# Patient Record
Sex: Male | Born: 1960 | Race: White | Hispanic: No | Marital: Married | Smoking: Never smoker
Health system: Southern US, Community
[De-identification: ages and names within clinical notes are randomized; demographics above are authoritative.]

## PROBLEM LIST (undated history)

## (undated) DIAGNOSIS — I10 Essential (primary) hypertension: Secondary | ICD-10-CM

## (undated) DIAGNOSIS — R972 Elevated prostate specific antigen [PSA]: Secondary | ICD-10-CM

## (undated) DIAGNOSIS — Z87442 Personal history of urinary calculi: Secondary | ICD-10-CM

## (undated) DIAGNOSIS — Z973 Presence of spectacles and contact lenses: Secondary | ICD-10-CM

## (undated) DIAGNOSIS — L309 Dermatitis, unspecified: Secondary | ICD-10-CM

## (undated) DIAGNOSIS — K219 Gastro-esophageal reflux disease without esophagitis: Secondary | ICD-10-CM

## (undated) DIAGNOSIS — G4730 Sleep apnea, unspecified: Secondary | ICD-10-CM

## (undated) DIAGNOSIS — C801 Malignant (primary) neoplasm, unspecified: Secondary | ICD-10-CM

## (undated) DIAGNOSIS — F419 Anxiety disorder, unspecified: Secondary | ICD-10-CM

## (undated) DIAGNOSIS — F32A Depression, unspecified: Secondary | ICD-10-CM

## (undated) DIAGNOSIS — J45909 Unspecified asthma, uncomplicated: Secondary | ICD-10-CM

## (undated) HISTORY — PX: CHOLECYSTECTOMY: SHX55

## (undated) HISTORY — PX: WISDOM TOOTH EXTRACTION: SHX21

---

## 2003-04-01 HISTORY — PX: CHOLECYSTECTOMY: SHX55

## 2004-02-12 ENCOUNTER — Encounter: Admission: RE | Admit: 2004-02-12 | Discharge: 2004-02-12 | Payer: Self-pay | Admitting: Emergency Medicine

## 2004-06-20 ENCOUNTER — Encounter: Admission: RE | Admit: 2004-06-20 | Discharge: 2004-06-20 | Payer: Self-pay | Admitting: Surgery

## 2006-07-17 IMAGING — CR DG CHEST 2V
2 series · 2 of 2 positions shown · non-contrast
Comparison: none

CLINICAL DATA: Pre-op cholecystectomy. 
 CHEST, TWO VIEWS: 
 Submaximal inspiration is noted.   The heart size and mediastinal contours are unremarkable.  The lungs are clear.  The visualized skeleton is unremarkable.

[view not recorded (1 of 2)]
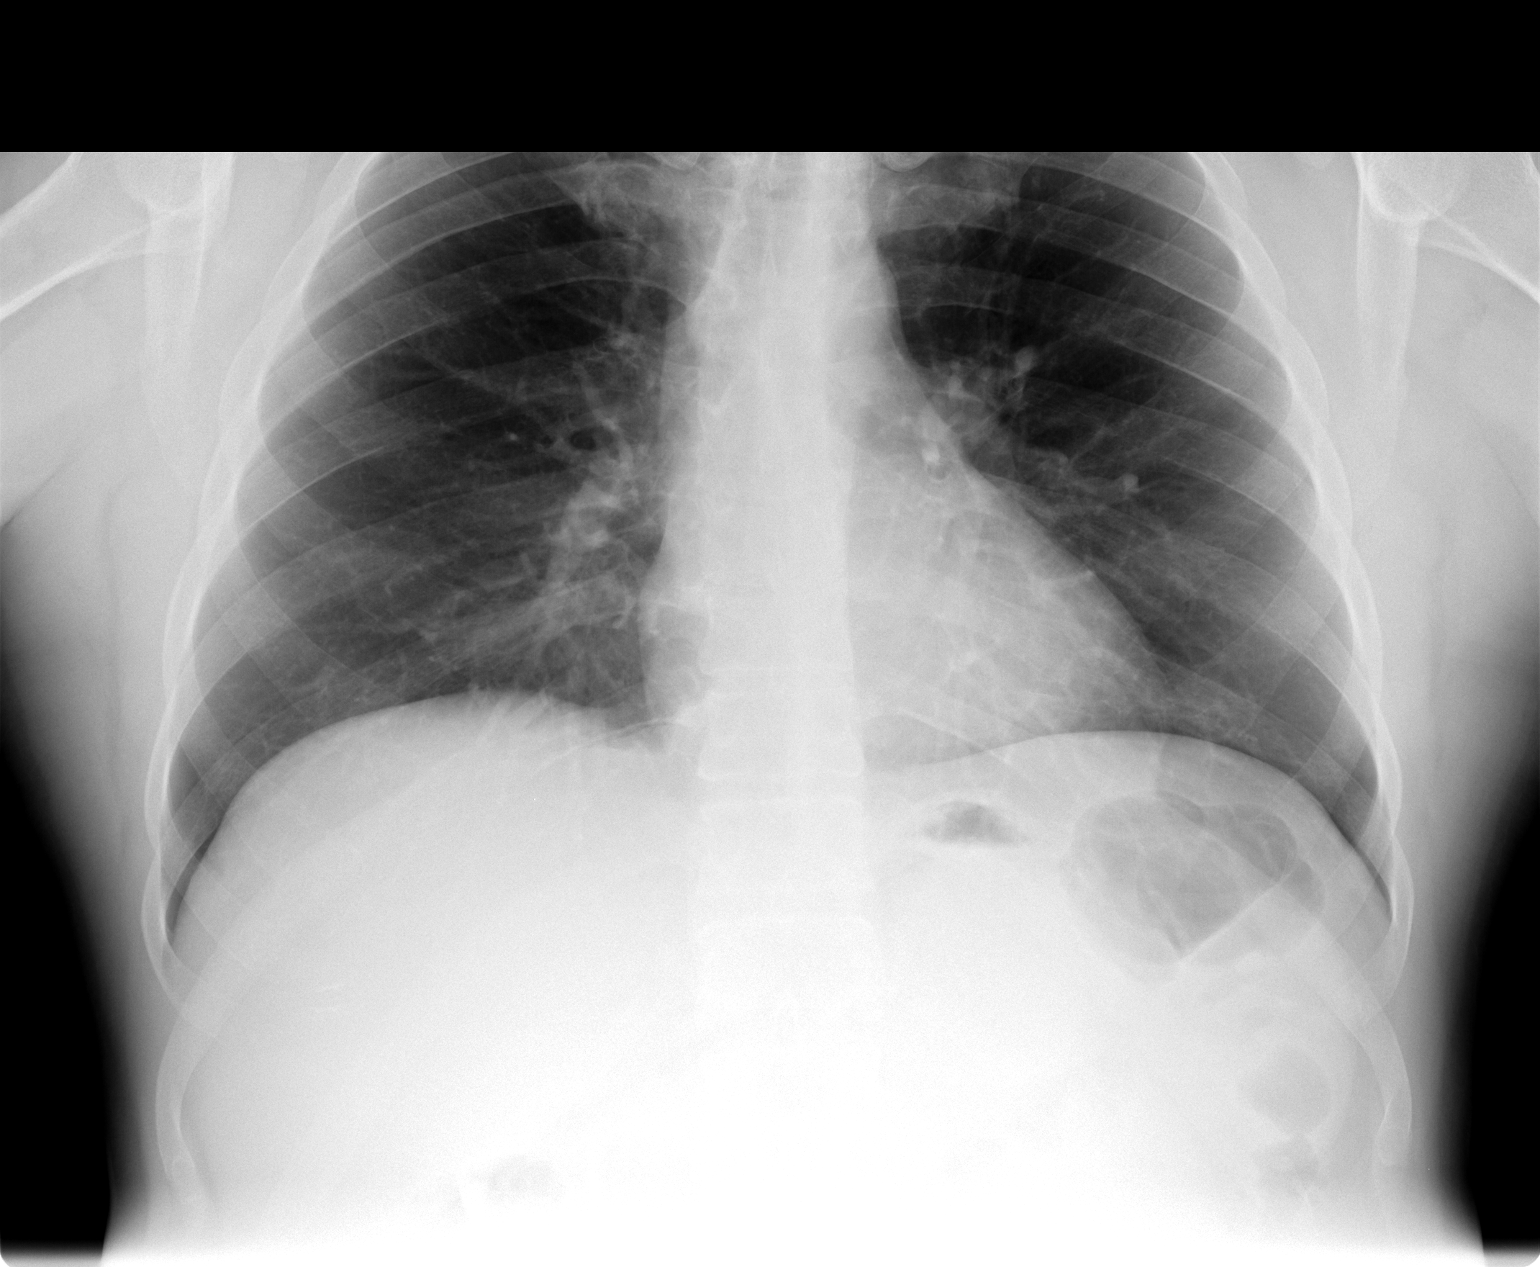

[view not recorded (2 of 2)]
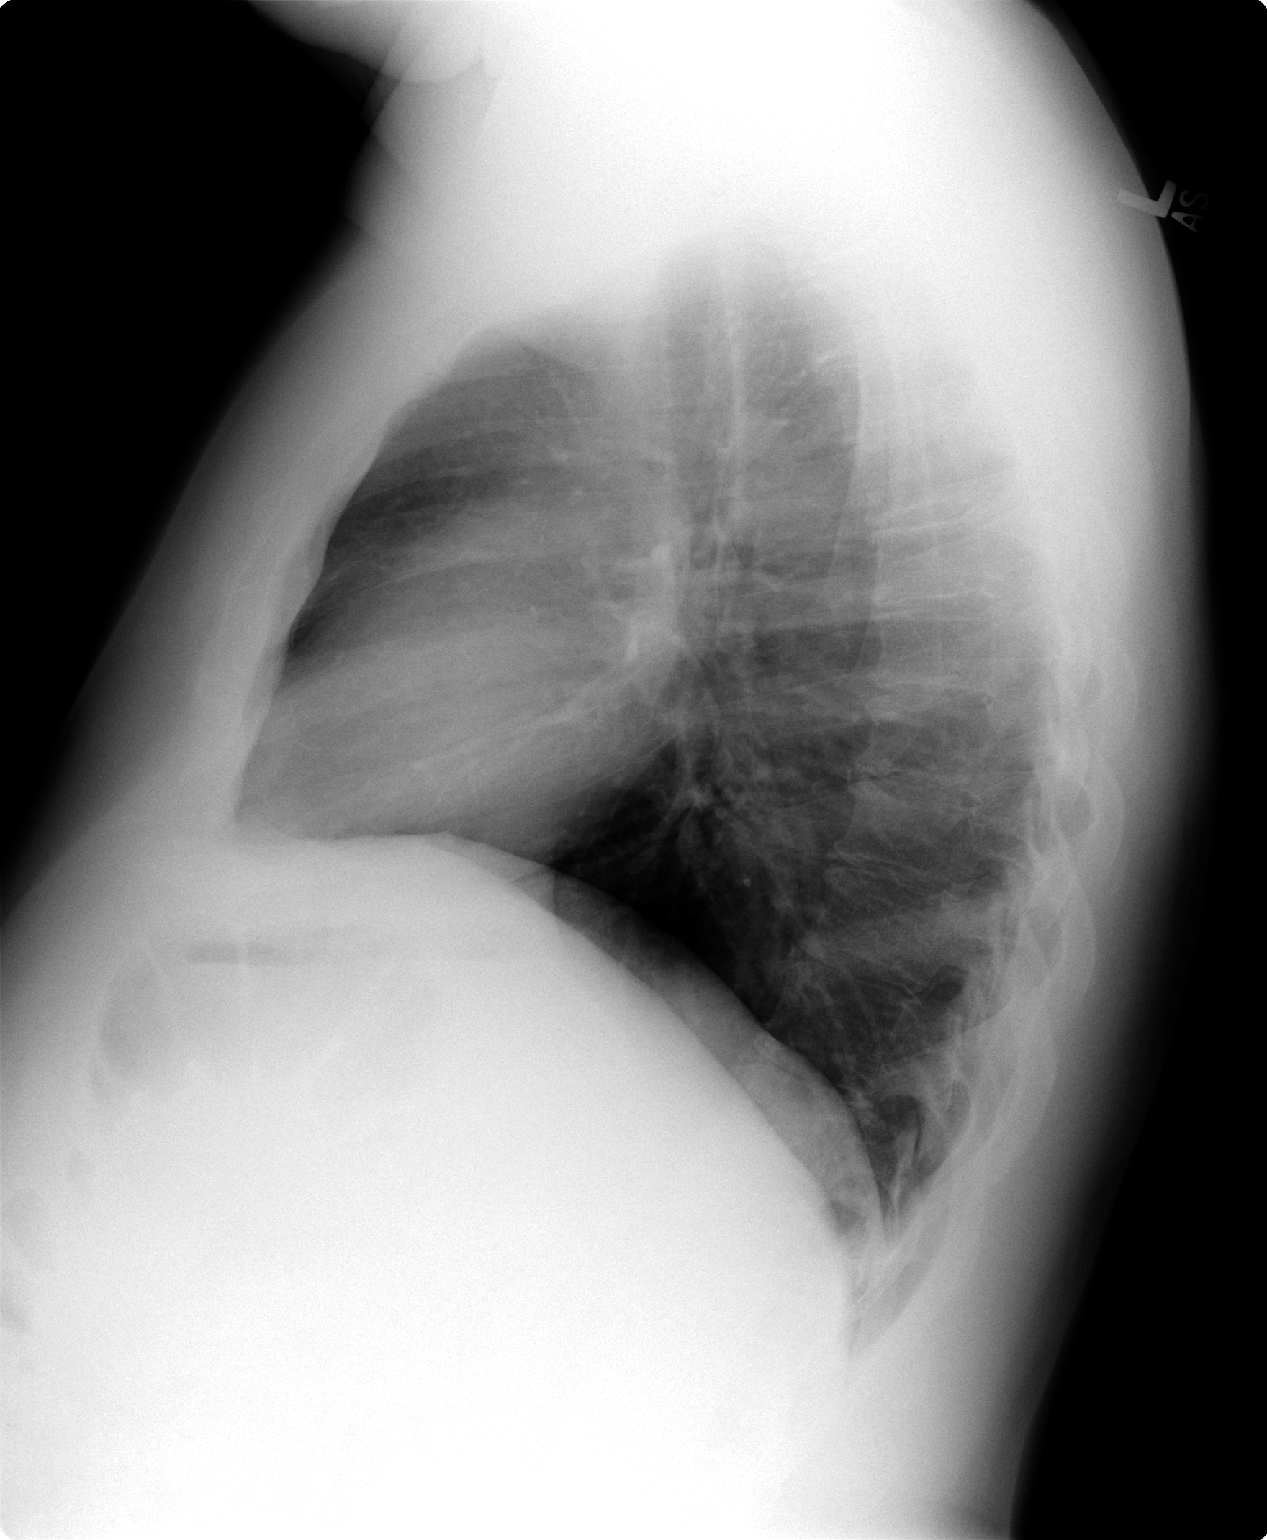

[2 of 2 positions shown; findings below may reference images not displayed]

IMPRESSION: 1.  Submaximal inspiration. 
 2.  No active disease.

## 2008-10-31 ENCOUNTER — Ambulatory Visit: Payer: Self-pay | Admitting: Family Medicine

## 2008-10-31 DIAGNOSIS — F329 Major depressive disorder, single episode, unspecified: Secondary | ICD-10-CM

## 2008-10-31 DIAGNOSIS — F909 Attention-deficit hyperactivity disorder, unspecified type: Secondary | ICD-10-CM | POA: Insufficient documentation

## 2008-10-31 DIAGNOSIS — E785 Hyperlipidemia, unspecified: Secondary | ICD-10-CM

## 2008-10-31 DIAGNOSIS — IMO0002 Reserved for concepts with insufficient information to code with codable children: Secondary | ICD-10-CM | POA: Insufficient documentation

## 2008-11-06 ENCOUNTER — Telehealth: Payer: Self-pay | Admitting: Family Medicine

## 2008-11-24 ENCOUNTER — Telehealth: Payer: Self-pay | Admitting: Family Medicine

## 2009-01-02 ENCOUNTER — Telehealth: Payer: Self-pay | Admitting: Family Medicine

## 2009-02-01 ENCOUNTER — Telehealth: Payer: Self-pay | Admitting: Family Medicine

## 2009-02-13 ENCOUNTER — Encounter (INDEPENDENT_AMBULATORY_CARE_PROVIDER_SITE_OTHER): Payer: Self-pay | Admitting: *Deleted

## 2009-04-03 ENCOUNTER — Telehealth: Payer: Self-pay | Admitting: Family Medicine

## 2009-05-07 ENCOUNTER — Telehealth: Payer: Self-pay | Admitting: Family Medicine

## 2009-06-14 ENCOUNTER — Telehealth: Payer: Self-pay | Admitting: Family Medicine

## 2009-07-05 ENCOUNTER — Telehealth: Payer: Self-pay | Admitting: Family Medicine

## 2009-09-14 ENCOUNTER — Telehealth: Payer: Self-pay | Admitting: Family Medicine

## 2009-10-03 ENCOUNTER — Telehealth: Payer: Self-pay | Admitting: Family Medicine

## 2009-11-23 ENCOUNTER — Telehealth: Payer: Self-pay | Admitting: Family Medicine

## 2009-12-07 ENCOUNTER — Telehealth: Payer: Self-pay | Admitting: Family Medicine

## 2009-12-11 ENCOUNTER — Ambulatory Visit: Payer: Self-pay | Admitting: Family Medicine

## 2009-12-11 LAB — CONVERTED CEMR LAB
ALT: 43 units/L (ref 0–53)
AST: 30 units/L (ref 0–37)
BUN: 16 mg/dL (ref 6–23)
Basophils Absolute: 0 10*3/uL (ref 0.0–0.1)
Bilirubin Urine: NEGATIVE
Bilirubin, Direct: 0.1 mg/dL (ref 0.0–0.3)
Calcium: 9.2 mg/dL (ref 8.4–10.5)
Cholesterol: 164 mg/dL (ref 0–200)
Creatinine, Ser: 1.1 mg/dL (ref 0.4–1.5)
Eosinophils Relative: 2.1 % (ref 0.0–5.0)
GFR calc non Af Amer: 75.04 mL/min (ref >60)
HCT: 44.3 % (ref 39.0–52.0)
HDL: 34 mg/dL — ABNORMAL LOW (ref >39.00)
LDL Cholesterol: 102 mg/dL — ABNORMAL HIGH (ref 0–99)
Lymphocytes Relative: 22.6 % (ref 12.0–46.0)
Monocytes Relative: 7.3 % (ref 3.0–12.0)
Neutrophils Relative %: 67.5 % (ref 43.0–77.0)
Platelets: 290 10*3/uL (ref 150.0–400.0)
Potassium: 3.1 meq/L — ABNORMAL LOW (ref 3.5–5.1)
Protein, U semiquant: NEGATIVE
Total Bilirubin: 0.7 mg/dL (ref 0.3–1.2)
Urobilinogen, UA: 1
VLDL: 27.6 mg/dL (ref 0.0–40.0)
WBC: 7.3 10*3/uL (ref 4.5–10.5)

## 2009-12-27 ENCOUNTER — Telehealth: Payer: Self-pay | Admitting: Family Medicine

## 2010-01-04 ENCOUNTER — Telehealth: Payer: Self-pay | Admitting: Family Medicine

## 2010-01-10 ENCOUNTER — Ambulatory Visit: Payer: Self-pay | Admitting: Family Medicine

## 2010-01-11 ENCOUNTER — Telehealth (INDEPENDENT_AMBULATORY_CARE_PROVIDER_SITE_OTHER): Payer: Self-pay | Admitting: *Deleted

## 2010-01-15 ENCOUNTER — Telehealth: Payer: Self-pay | Admitting: Family Medicine

## 2010-01-17 ENCOUNTER — Encounter: Payer: Self-pay | Admitting: Family Medicine

## 2010-01-17 ENCOUNTER — Ambulatory Visit: Payer: Self-pay | Admitting: Family Medicine

## 2010-01-23 ENCOUNTER — Encounter: Payer: Self-pay | Admitting: Family Medicine

## 2010-01-31 ENCOUNTER — Ambulatory Visit: Payer: Self-pay | Admitting: Family Medicine

## 2010-02-11 ENCOUNTER — Telehealth: Payer: Self-pay | Admitting: Family Medicine

## 2010-03-07 ENCOUNTER — Ambulatory Visit: Payer: Self-pay | Admitting: Family Medicine

## 2010-03-21 ENCOUNTER — Telehealth: Payer: Self-pay | Admitting: Family Medicine

## 2010-03-28 ENCOUNTER — Encounter: Payer: Self-pay | Admitting: Family Medicine

## 2010-04-10 ENCOUNTER — Telehealth: Payer: Self-pay | Admitting: *Deleted

## 2010-04-20 ENCOUNTER — Encounter: Payer: Self-pay | Admitting: Emergency Medicine

## 2010-04-21 ENCOUNTER — Encounter: Payer: Self-pay | Admitting: Emergency Medicine

## 2010-04-30 NOTE — Progress Notes (Signed)
Summary: Pt req script for Budeprion XL 300mg   Phone Note Refill Request Call back at Work Phone [phone] Message from:  Patient on December 07, 2009 4:41 PM  Refills Requested: Medication #1:  BUDEPRION XL 300 MG XR24H-TAB take one tab by mouth once daily.   Dosage confirmed as above?Dosage Confirmed   Supply Requested: 6 months  Method [facility]ed[facility] [address] Initial call taken by: Lucy Antigua,  December 07, 2009 4:41 PM    Prescriptions: BUDEPRION XL 300 MG XR24H-TAB (BUPROPION HCL) take one tab by mouth once daily  #30 x 0   Entered by:   Kern Reap CMA (AAMA)   Authorized by:   Roderick Pee MD   Signed by:   Kern Reap CMA (AAMA) on 12/07/2009   Method used:   [facility] to        [facility]  [phone][facility] (retail)       [address] [place], [place]  [address]       Ph: [phone] or [phone]       Fax: [phone]   RxID:   [id number]

## 2010-04-30 NOTE — Assessment & Plan Note (Signed)
Summary: ROA/FUP/RCD   Vital Signs:  Patient profile:   50 year old male Weight:      187 pounds Temp:     98.0 degrees F oral BP sitting:   130 / 90  (left arm) Cuff size:   regular  Vitals Entered By: Kern Reap CMA Duncan Dull) (March 07, 2010 12:35 PM) CC: follow-up visit   CC:  follow-up visit.  History of Present Illness: Gary Rose is a 50 year old, young, who comes in today for follow-up of hypertension.  His blood pressure was elevated.  Therefore, we start him on Norvasc 5 mg daily 4 weeks ago, a week ago, increase the dose to 10 mg daily because his blood pressure was not back to normal.  Now his BP is running 130/80.  No side effects from medication  Allergies: No Known Drug Allergies  Past History:  Past medical, surgical, family and social histories (including risk factors) reviewed for relevance to current acute and chronic problems.  Past Medical History: Reviewed history from 10/31/2008 and no changes required. depression OCD  Past Surgical History: Reviewed history from 10/31/2008 and no changes required. Cholecystectomy  Family History: Reviewed history from 10/31/2008 and no changes required. Father:Heart disease, demenia - deceased Mother: thyroid, brain cancer - deceased Siblings: 1 brother - healthy  Social History: Reviewed history from 10/31/2008 and no changes required. Occupation:music teacher Married Alcohol use-yes Drug use-no  Review of Systems      See HPI  Physical Exam  General:  Well-developed,well-nourished,in no acute distress; alert,appropriate and cooperative throughout examination   Impression & Recommendations:  Problem # 1:  HYPERTENSION NEC (ICD-997.91) Assessment Improved  Complete Medication List: 1)  Sertraline Hcl 100 Mg Tabs (Sertraline hcl) .... Take one half tab once daily 2)  Pravastatin Sodium 40 Mg Tabs (Pravastatin sodium) .... Take one tab at bedtime 3)  Daily Vitamins Tabs (Multiple vitamin) ....  Take one tab once daily 4)  Protopic 0.1 % Oint (Tacrolimus) .... As needed for excema 5)  Triamcinolone Acetonide 0.1 % Crea (Triamcinolone acetonide) .Marland Kitchen.. 1:1 compound with cetaphil .  as needed 6)  Clobetasol Propionate 0.05 % Oint (Clobetasol propionate) .... Apply once daily 7)  Budeprion Xl 300 Mg Xr24h-tab (Bupropion hcl) .... Take one tab by mouth once daily 8)  Viagra 50 Mg Tabs (Sildenafil citrate) .... Uad 9)  Adderall Xr 30 Mg Xr24h-cap (Amphetamine-dextroamphetamine) .... Take 1 tablet by mouth two times a day 10)  Norvasc 10 Mg Tabs (Amlodipine besylate) .... Take 1 tablet by mouth every morning  Patient Instructions: 1)  continue 10 mg of Norvasc daily. 2)  Check y  blood pressure daily for one month to be sure your blood pressure stays normal.  If after that your blood pressure is indeed normal.  Then, just check it once a week.  Return p.r.n. or in October 2012 ........... annual exam 3)  normal is 135/85 or less Prescriptions: NORVASC 10 MG TABS (AMLODIPINE BESYLATE) Take 1 tablet by mouth every morning  #100 x 3   Entered and Authorized by:   Roderick Pee MD   Signed by:   Roderick Pee MD on 03/07/2010   Method used:   [facility] to        [facility]  [phone][facility] (retail)       [address] [place], [place]  [address]       Ph: [phone] or [phone]       Fax: [phone]  RxID:   [id number]    Orders Added: 1)  Est. Patient Level III [14782]

## 2010-04-30 NOTE — Progress Notes (Signed)
Summary: Pt req script for Prevastatin 40mg   Phone Note Refill Request Call back at [phone] cell or [phone] Message from:  Patient  Refills Requested: Medication #1:  PRAVASTATIN SODIUM 40 MG TABS take one tab at bedtime   Dosage confirmed as above?Dosage Confirmed Pls [facility] [address] [facility] [address]   Method Requested: Telephone to Pharmacy Initial call taken by: Lucy Antigua,  November 23, 2009 4:29 PM    Prescriptions: PRAVASTATIN SODIUM 40 MG TABS (PRAVASTATIN SODIUM) take one tab at bedtime  #30 x 0   Entered by:   Kern Reap CMA (AAMA)   Authorized by:   Roderick Pee MD   Signed by:   Kern Reap CMA (AAMA) on 11/23/2009   Method used:   [facility] to        [facility]  [phone]* (retail)       [address] [place], [place]  [address]       Ph: [phone] or [phone]       Fax: [phone]   RxID:   [id number]

## 2010-04-30 NOTE — Assessment & Plan Note (Signed)
Summary: 2 WEEK FUP/CJR   Vital Signs:  Patient profile:   50 year old male Height:      66 inches Weight:      191 pounds BMI:     30.94 Temp:     99.0 degrees F oral BP sitting:   138 / 97  (left arm) Cuff size:   regular  Vitals Entered By: Kern Reap CMA Duncan Dull) (January 31, 2010 12:38 PM) CC: follow-up visit   CC:  follow-up visit.  History of Present Illness: Shey is a 50 year old male, who comes in today accompanied by his wife for evaluation of ADD and hypertension.  He is currently taking the long-acting Adderall twice daily with 10 mg supplement regular twice daily.  However, the pharmacy continues to not have this medication.  We stopped the chlorthalidone because his potassium was low.  His blood pressure continues to be elevated today.  I get 150 over hundred  Allergies: No Known Drug Allergies PMH-FH-SH reviewed for relevance  Review of Systems      See HPI  Physical Exam  General:  Well-developed,well-nourished,in no acute distress; alert,appropriate and cooperative throughout examination   Impression & Recommendations:  Problem # 1:  HYPERTENSION NEC (ICD-997.91) Assessment Deteriorated  Problem # 2:  ATTENTION DEFICIT HYPERACTIVITY DISORDER, ADULT (ICD-314.01) Assessment: Improved  Complete Medication List: 1)  Sertraline Hcl 100 Mg Tabs (Sertraline hcl) .... Take one half tab once daily 2)  Pravastatin Sodium 40 Mg Tabs (Pravastatin sodium) .... Take one tab at bedtime 3)  Daily Vitamins Tabs (Multiple vitamin) .... Take one tab once daily 4)  Protopic 0.1 % Oint (Tacrolimus) .... As needed for excema 5)  Triamcinolone Acetonide 0.1 % Crea (Triamcinolone acetonide) .Marland Kitchen.. 1:1 compound with cetaphil .  as needed 6)  Adderall Xr 20 Mg Xr24h-cap (Amphetamine-dextroamphetamine) .... Take 1 tablet by mouth two times a day 7)  Adderall 10 Mg Tabs (Amphetamine-dextroamphetamine) .... Take 1 tablet by mouth two times a day 8)  Chlorthalidone 25 Mg  Tabs (Chlorthalidone) .... Take 1 tablet by mouth every morning 9)  Clobetasol Propionate 0.05 % Oint (Clobetasol propionate) .... Apply once daily 10)  Budeprion Xl 300 Mg Xr24h-tab (Bupropion hcl) .... Take one tab by mouth once daily 11)  Viagra 50 Mg Tabs (Sildenafil citrate) .... Uad 12)  Adderall Xr 30 Mg Xr24h-cap (Amphetamine-dextroamphetamine) .... Take 1 tablet by mouth two times a day 13)  Zestril 10 Mg Tabs (Lisinopril) .... Take 1 tablet by mouth every morning  Patient Instructions: 1)  takes the Adderall long-acting 30 mg twice daily. 2)  Begin Zestril 10 mg one tablet in the morning.  Check her blood pressure daily in the morning.  Return in 4 weeks for follow-up with the data and the device. 3)  The potential side effects from the Zestril ....... hives and a cough Prescriptions: ZESTRIL 10 MG TABS (LISINOPRIL) Take 1 tablet by mouth every morning  #30 x 1   Entered and Authorized by:   Roderick Pee MD   Signed by:   Roderick Pee MD on 01/31/2010   Method used:   Print then Give to Patient   RxID:   [id number] ADDERALL XR 30 MG XR24H-CAP (AMPHETAMINE-DEXTROAMPHETAMINE) Take 1 tablet by mouth two times a day  #60 x 0   Entered and Authorized by:   Roderick Pee MD   Signed by:   Roderick Pee MD on 01/31/2010   Method used:   Print then Give to Patient  RxID:   [id number]    Orders Added: 1)  Est. Patient Level IV [95621]

## 2010-04-30 NOTE — Progress Notes (Signed)
Summary: new rxs  Phone Note Refill Request Call back at Work Phone [phone] Message from:  Patient  Refills Requested: Medication #1:  METHYLPHENIDATE HCL 20 MG TABS take one tab two times a day  Medication #2:  METHYLPHENIDATE HCL 10 MG TABS take one tab two times a day Initial call taken by: Heron Sabins,  October 03, 2009 4:45 PM    Prescriptions: METHYLPHENIDATE HCL 10 MG TABS (METHYLPHENIDATE HCL) take one tab two times a day  fill in two months  #60 x 0   Entered by:   Kern Reap CMA (AAMA)   Authorized by:   Roderick Pee MD   Signed by:   Kern Reap CMA (AAMA) on 10/04/2009   Method used:   Print then Give to Patient   RxID:   [id number] METHYLPHENIDATE HCL 10 MG TABS (METHYLPHENIDATE HCL) take one tab two times a day   fill in one month  #60 x 0   Entered by:   Kern Reap CMA (AAMA)   Authorized by:   Roderick Pee MD   Signed by:   Kern Reap CMA (AAMA) on 10/04/2009   Method used:   Print then Give to Patient   RxID:   [id number] METHYLPHENIDATE HCL 10 MG TABS (METHYLPHENIDATE HCL) take one tab two times a day  #60 x 0   Entered by:   Kern Reap CMA (AAMA)   Authorized by:   Roderick Pee MD   Signed by:   Kern Reap CMA (AAMA) on 10/04/2009   Method used:   Print then Give to Patient   RxID:   [id number] METHYLPHENIDATE HCL 20 MG TABS (METHYLPHENIDATE HCL) take one tab two times a day   fill in one month  #60 x 0   Entered by:   Kern Reap CMA (AAMA)   Authorized by:   Roderick Pee MD   Signed by:   Kern Reap CMA (AAMA) on 10/04/2009   Method used:   Print then Give to Patient   RxID:   [id number] METHYLPHENIDATE HCL 20 MG TABS (METHYLPHENIDATE HCL) take one tab two times a day   fill in two months  #60 x 0   Entered by:   Kern Reap CMA (AAMA)   Authorized by:   Roderick Pee MD   Signed by:   Kern Reap CMA (AAMA) on 10/04/2009   Method used:   Print then Give to Patient   RxID:    [id number] METHYLPHENIDATE HCL 20 MG TABS (METHYLPHENIDATE HCL) take one tab two times a day  #60 x 0   Entered by:   Kern Reap CMA (AAMA)   Authorized by:   Roderick Pee MD   Signed by:   Kern Reap CMA (AAMA) on 10/04/2009   Method used:   Print then Give to Patient   RxID:   [id number]

## 2010-04-30 NOTE — Progress Notes (Signed)
Summary: please refill today  Phone Note Refill Request Call back at Home Phone [phone] Message from:  Patient---live call  Refills Requested: Medication #1:  BUDEPRION XL 300 MG XR24H-TAB take one tab by mouth once daily. [facility] pt has an appt this Thursday with Dr Tawanna Cooler. pt stated that he is out of meds. please let him know when done.  Initial call taken by: Warnell Forester,  January 15, 2010 8:49 AM    Prescriptions: BUDEPRION XL 300 MG XR24H-TAB (BUPROPION HCL) take one tab by mouth once daily  #30 x 0   Entered by:   Kern Reap CMA (AAMA)   Authorized by:   Roderick Pee MD   Signed by:   Kern Reap CMA (AAMA) on 01/15/2010   Method used:   [facility]        [facility]  [phone]* (retail)       [address] [place], [place]  [address]       Ph: [phone] or [phone]       Fax: [phone]   RxID:   [id number]

## 2010-04-30 NOTE — Progress Notes (Signed)
Summary: Pt req refill of Sertraline HCL 100mg   Phone Note Call from Patient Call back at Work Phone [phone]   Caller: Patient Reason for Call: Referral Summary of Call: Pt called req refill of Sertraline HCL 100mg .  [facility] [address]   Initial call taken by: Lucy Antigua,  June 14, 2009 12:09 PM    Prescriptions: SERTRALINE HCL 100 MG TABS (SERTRALINE HCL) take one half tab once daily  #50 x 6   Entered by:   Kern Reap CMA (AAMA)   Authorized by:   Roderick Pee MD   Signed by:   Kern Reap CMA (AAMA) on 06/14/2009   Method used:   [facility] to        [facility]  [phone]* (retail)       [address] [place], [place]  [address]       Ph: [phone] or [phone]       Fax: [phone]   RxID:   [id number]

## 2010-04-30 NOTE — Assessment & Plan Note (Signed)
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  We asked him to come back in the spring of 2011 for his annual exam.  Since he just came and one time as a new patient, however, he never made the appointment.  I explained to him in the future, we would have to see him yearly and not be able to write his medication if he can't come back on a yearly basis for complete evaluation  He also has difficulty with erectile dysfunction.  He says when he is off his Wellbutrin.  His sexual function is improved  Allergies (verified): No Known Drug Allergies  Past History:  Past medical, surgical, family and social histories (including risk factors) reviewed, and no changes noted (except as noted below).  Past Medical History: Reviewed history from 10/31/2008 and no changes required. depression OCD  Past Surgical History: Reviewed history from 10/31/2008 and no changes required. Cholecystectomy  Family History: Reviewed history from 10/31/2008 and no changes required. Father:Heart disease, demenia - deceased Mother: thyroid, brain cancer - deceased Siblings: 1 brother - healthy  Social History: Reviewed history from 10/31/2008 and no changes required. Occupation:music teacher Married Alcohol use-yes Drug use-no  Review of Systems      See HPI       Flu Vaccine Consent Questions     Do you have a history of severe allergic reactions to this vaccine? no    Any prior history of allergic reactions to egg and/or gelatin? no    Do  you have a sensitivity to the preservative Thimersol? no    Do you have a past history of Guillan-Barre Syndrome? no    Do you currently have an acute febrile illness? no    Have you ever had a severe reaction to latex? no    Vaccine information given and explained to patient? yes    Are you currently pregnant? no    Lot Number:[id number]   Exp Date:09/28/2010   Site Given  Left Deltoid IM  Physical Exam  General:  Well-developed,well-nourished,in no acute distress; alert,appropriate and cooperative throughout examination Head:  Normocephalic and atraumatic without obvious abnormalities. No apparent alopecia or balding. Eyes:  No corneal or conjunctival inflammation noted. EOMI. Perrla. Funduscopic exam benign, without hemorrhages, exudates or papilledema. Vision grossly normal. Ears:  External ear exam shows no significant lesions or deformities.  Otoscopic examination reveals clear canals, tympanic membranes are intact bilaterally without bulging, retraction, inflammation or discharge. Hearing is grossly normal bilaterally. Nose:  External nasal examination shows no deformity or inflammation. Nasal mucosa are pink and moist without lesions or exudates. Mouth:  Oral mucosa and oropharynx without lesions or exudates.  Teeth in good repair. Neck:  No deformities, masses, or tenderness noted. Chest Wall:  No deformities, masses, tenderness or gynecomastia noted. Breasts:  No masses or gynecomastia noted Lungs:  Normal respiratory effort, chest expands symmetrically. Lungs are clear to auscultation, no crackles or wheezes. Heart:  Normal rate and regular rhythm.  S1 and S2 normal without gallop, murmur, click, rub or other extra sounds. Abdomen:  Bowel sounds positive,abdomen soft and non-tender without masses, organomegaly or hernias noted. Rectal:  No external abnormalities noted. Normal sphincter tone. No rectal masses or tenderness. Genitalia:  Testes bilaterally descended without nodularity,  tenderness or masses. No scrotal masses or lesions. No penis lesions or urethral discharge. Prostate:  Prostate gland firm and smooth, no enlargement, nodularity, tenderness, mass, asymmetry or induration. Msk:  No deformity or scoliosis noted of thoracic or lumbar spine.   Pulses:  R and L carotid,radial,femoral,dorsalis pedis and posterior tibial pulses are full and equal bilaterally Extremities:  No clubbing, cyanosis, edema, or deformity noted with normal full range of motion of all joints.   Neurologic:  No cranial nerve deficits noted. Station and gait are normal. Plantar reflexes are down-going bilaterally. DTRs are symmetrical throughout. Sensory, motor and coordinative functions appear intact. Skin:  Intact without suspicious lesions or rashes Cervical Nodes:  No lymphadenopathy noted Axillary Nodes:  No palpable lymphadenopathy Inguinal Nodes:  No significant adenopathy Psych:  Cognition and judgment appear intact. Alert and cooperative with normal attention span and concentration. No apparent delusions, illusions, hallucinations   Impression & Recommendations:  Problem # 1:  HYPERTENSION NEC (ICD-997.91) Assessment Improved  Orders: Prescription Created Electronically [phone])  Problem # 2:  ATTENTION DEFICIT HYPERACTIVITY DISORDER, ADULT (ICD-314.01) Assessment: Deteriorated  Orders: Prescription Created Electronically [phone])  Problem # 3:  HYPERLIPIDEMIA (ICD-272.4) Assessment: Improved  His updated medication list for this problem includes:    Pravastatin Sodium 40 Mg Tabs (Pravastatin sodium) .Marland Kitchen... Take one tab at bedtime  Orders: Prescription Created Electronically [phone]) EKG w/ Interpretation (93000)  Problem # 4:  DEPRESSION (ICD-311) Assessment: Unchanged  His updated medication list for this problem includes:    Sertraline Hcl 100 Mg Tabs (Sertraline hcl) .Marland Kitchen... Take one half tab once daily    Budeprion Xl 300 Mg Xr24h-tab (Bupropion hcl) .Marland Kitchen... Take one  tab by mouth once daily  Orders: Prescription Created Electronically [id number])  Problem # 5:  Preventive Health Care (ICD-V70.0) Assessment: Unchanged  Complete Medication List: 1)  Sertraline Hcl 100 Mg Tabs (Sertraline hcl) .... Take one half tab once daily 2)  Pravastatin Sodium 40 Mg Tabs (Pravastatin sodium) .... Take one tab at bedtime 3)  Daily Vitamins Tabs (Multiple vitamin) .... Take one tab once daily 4)  Protopic 0.1 % Oint (Tacrolimus) .... As needed for excema 5)  Triamcinolone Acetonide 0.1 % Crea (Triamcinolone acetonide) .Marland Kitchen.. 1:1 compound with cetaphil .  as needed 6)  Adderall Xr 20 Mg Xr24h-cap (Amphetamine-dextroamphetamine) .... Take 1 tablet by mouth two times a day 7)  Adderall 10 Mg Tabs (Amphetamine-dextroamphetamine) .... Take 1 tablet by mouth two times a day 8)  Chlorthalidone 25 Mg Tabs (Chlorthalidone) .... Take 1 tablet by mouth every morning 9)  Clobetasol Propionate 0.05 % Oint (Clobetasol propionate) .... Apply once daily 10)  Budeprion Xl 300 Mg Xr24h-tab (Bupropion hcl) .... Take one tab by mouth once daily 11)  Viagra 50 Mg Tabs (Sildenafil citrate) .... Uad  Other Orders: Tdap => 17yrs IM (69629) Admin 1st Vaccine (52841) Flu Vaccine 86yrs + (32440)  Patient Instructions: 1)  Please schedule a follow-up appointment as needed. 2)  It is important that you exercise regularly at least 20 minutes 5 times a week. If you develop chest pain, have severe difficulty breathing, or feel very tired , stop exercising immediately and seek medical attention. 3)  Schedule a colonoscopy/sigmoidoscopy to help  detect colon cancer. 4)  Take an Aspirin every day. 5)  Check your Blood Pressure daily in the morning and stop the chlorthalidone return in two weeks with all your blood pressure readings and the device Prescriptions: CLOBETASOL PROPIONATE 0.05 % OINT (CLOBETASOL PROPIONATE) apply once daily  #100 g x 5   Entered and Authorized by:   Roderick Pee MD   Signed  by:   Roderick Pee MD on 01/17/2010   Method used:   Print then Give to Patient   RxID:   [id number] CHLORTHALIDONE 25 MG TABS (CHLORTHALIDONE) Take 1 tablet by mouth every morning  #90 x 3   Entered and Authorized by:   Roderick Pee MD   Signed by:   Roderick Pee MD on 01/17/2010   Method used:   Print then Give to Patient   RxID:   [id number] ADDERALL 10 MG TABS (AMPHETAMINE-DEXTROAMPHETAMINE) Take 1 tablet by mouth two times a day  #60 x 0   Entered and Authorized by:   Roderick Pee MD   Signed by:   Roderick Pee MD on 01/17/2010   Method used:   Print then Give to Patient   RxID:   [id number] ADDERALL XR 20 MG XR24H-CAP (AMPHETAMINE-DEXTROAMPHETAMINE) Take 1 tablet by mouth two times a day  #60 x 0   Entered and Authorized by:   Roderick Pee MD   Signed by:   Roderick Pee MD on 01/17/2010   Method used:   Print then Give to Patient   RxID:   [id number] TRIAMCINOLONE ACETONIDE 0.1 % CREA (TRIAMCINOLONE ACETONIDE) 1:1 compound with cetaphil .  as needed  #1 pound jar x 5   Entered and Authorized by:   Roderick Pee MD   Signed by:   Roderick Pee MD on 01/17/2010   Method used:   Print then Give to Patient   RxID:   [id number] PROTOPIC 0.1 % OINT (TACROLIMUS) as needed for excema  #100 gr. x 5   Entered and Authorized by:   Roderick Pee MD   Signed by:   Roderick Pee MD on 01/17/2010   Method used:   Print then Give to Patient   RxID:   [id number] PRAVASTATIN SODIUM 40 MG TABS (PRAVASTATIN SODIUM) take one tab at bedtime  #90 x 3   Entered and Authorized by:   Roderick Pee MD   Signed by:   Roderick Pee MD on 01/17/2010   Method used:   Print then Give to Patient   RxID:   [id number] SERTRALINE HCL 100 MG TABS (SERTRALINE HCL) take one half tab once daily  #50 x 3   Entered and Authorized by:   Roderick Pee MD   Signed by:   Roderick Pee MD on 01/17/2010   Method used:   Print then Give to Patient   RxID:    [id number] VIAGRA 50 MG TABS (SILDENAFIL CITRATE) UAD  #6 x 11   Entered and Authorized by:   Roderick Pee MD   Signed by:   Roderick Pee MD on 01/17/2010   Method used:   Print then Give to Patient   RxID:   [id number]    Orders Added: 1)  Prescription Created Electronically [G8553] 2)  Est. Patient 40-64 years [27035] 3)  EKG w/ Interpretation [93000] 4)  Tdap => 73yrs IM [90715] 5)  Admin 1st Vaccine [90471] 6)  Flu Vaccine 47yrs + [00938]  Immunizations Administered:  Tetanus Vaccine:    Vaccine Type: Tdap    Site: right deltoid    Mfr: GlaxoSmithKline    Dose: 0.5 ml    Route: IM    Given by: Kern Reap CMA (AAMA)    Exp. Date: 01/18/2012    Lot #: [id number]    Physician counseled: yes   Immunizations Administered:  Tetanus Vaccine:    Vaccine Type: Tdap    Site: right deltoid    Mfr: GlaxoSmithKline    Dose: 0.5 ml    Route: IM    Given by: Kern Reap CMA (AAMA)    Exp. Date: 01/18/2012    Lot #: [id number]    Physician counseled: yes

## 2010-04-30 NOTE — Progress Notes (Signed)
Summary: REQ FOR MED REFILL  Phone Note Call from Patient   Caller: Patient [phone] Reason for Call: Talk to Nurse, Talk to Doctor Summary of Call: Pt called in to req a refill on med (METHYLPHENIDATE HCL 10 MG TABS).... Pt adv that she can be reached at [phone] when same is ready for p/u.  Initial call taken by: Debbra Riding,  May 07, 2009 12:31 PM  Follow-up for Phone Call        Phone Call Completed Follow-up by: Kern Reap CMA Duncan Dull),  May 07, 2009 2:19 PM    Prescriptions: METHYLPHENIDATE HCL 20 MG TABS (METHYLPHENIDATE HCL) take one tab two times a day   fill in one month  #60 x 0   Entered by:   Kern Reap CMA (AAMA)   Authorized by:   Roderick Pee MD   Signed by:   Kern Reap CMA (AAMA) on 05/07/2009   Method used:   Print then Give to Patient   RxID:   [id number] METHYLPHENIDATE HCL 20 MG TABS (METHYLPHENIDATE HCL) take one tab two times a day   fill in two months  #60 x 0   Entered by:   Kern Reap CMA (AAMA)   Authorized by:   Roderick Pee MD   Signed by:   Kern Reap CMA (AAMA) on 05/07/2009   Method used:   Print then Give to Patient   RxID:   [id number] METHYLPHENIDATE HCL 20 MG TABS (METHYLPHENIDATE HCL) take one tab two times a day  #60 x 0   Entered by:   Kern Reap CMA (AAMA)   Authorized by:   Roderick Pee MD   Signed by:   Kern Reap CMA (AAMA) on 05/07/2009   Method used:   Print then Give to Patient   RxID:   [id number]

## 2010-04-30 NOTE — Progress Notes (Signed)
Summary: lost rx  Phone Note Call from Patient Call back at Work Phone [phone]   Caller: Patient Call For: Roderick Pee MD Summary of Call: pt last rx for methylphenidate 10mg  and 20 mg call when ready for pick up Initial call taken by: Heron Sabins,  April 03, 2009 12:45 PM  Follow-up for Phone Call        ok x 3 m Follow-up by: Roderick Pee MD,  April 03, 2009 1:01 PM  Additional Follow-up for Phone Call Additional follow up Details #1::        rx ready for pick up Additional Follow-up by: Kern Reap CMA Duncan Dull),  April 03, 2009 3:58 PM    Prescriptions: METHYLPHENIDATE HCL 10 MG TABS (METHYLPHENIDATE HCL) take one tab two times a day  fill in two months  #60 x 0   Entered by:   Kern Reap CMA (AAMA)   Authorized by:   Roderick Pee MD   Signed by:   Kern Reap CMA (AAMA) on 04/03/2009   Method used:   Print then Give to Patient   RxID:   [id number] METHYLPHENIDATE HCL 10 MG TABS (METHYLPHENIDATE HCL) take one tab two times a day   fill in one month  #60 x 0   Entered by:   Kern Reap CMA (AAMA)   Authorized by:   Roderick Pee MD   Signed by:   Kern Reap CMA (AAMA) on 04/03/2009   Method used:   Print then Give to Patient   RxID:   [id number] METHYLPHENIDATE HCL 10 MG TABS (METHYLPHENIDATE HCL) take one tab two times a day  #60 x 0   Entered by:   Kern Reap CMA (AAMA)   Authorized by:   Roderick Pee MD   Signed by:   Kern Reap CMA (AAMA) on 04/03/2009   Method used:   Print then Give to Patient   RxID:   [id number] METHYLPHENIDATE HCL 20 MG TABS (METHYLPHENIDATE HCL) take one tab two times a day   fill in one month  #60 x 0   Entered by:   Kern Reap CMA (AAMA)   Authorized by:   Roderick Pee MD   Signed by:   Kern Reap CMA (AAMA) on 04/03/2009   Method used:   Print then Give to Patient   RxID:   [id number] METHYLPHENIDATE HCL 20 MG TABS (METHYLPHENIDATE HCL) take one tab two times a  day   fill in two months  #60 x 0   Entered by:   Kern Reap CMA (AAMA)   Authorized by:   Roderick Pee MD   Signed by:   Kern Reap CMA (AAMA) on 04/03/2009   Method used:   Print then Give to Patient   RxID:   [id number] METHYLPHENIDATE HCL 20 MG TABS (METHYLPHENIDATE HCL) take one tab two times a day  #60 x 0   Entered by:   Kern Reap CMA (AAMA)   Authorized by:   Roderick Pee MD   Signed by:   Kern Reap CMA (AAMA) on 04/03/2009   Method used:   Print then Give to Patient   RxID:   [id number]

## 2010-04-30 NOTE — Progress Notes (Signed)
Summary: REFILL REQUEST  Phone Note Refill Request Message from:  Patient's wife on January 11, 2010 4:55 PM  Refills Requested: Medication #1:  BUDEPRION XL 300 MG XR24H-TAB take one tab by mouth once daily.   Notes[facility] [place]     Initial call taken by: Debbra Riding,  January 11, 2010 4:56 PM    Prescriptions: BUDEPRION XL 300 MG XR24H-TAB (BUPROPION HCL) take one tab by mouth once daily  #30 x 0   Entered by:   Rudy Jew, RN   Authorized by:   Roderick Pee MD   Signed by:   Rudy Jew, RN on 01/11/2010   Method used:   Electronically to        [facility]  [phone]* (retail)       [address] [place], [place]  [address]       Ph: [phone] or [phone]       Fax: [phone]   RxID:   [id number]   Appended Document: REFILL REQUEST Patient needs ov for medication refills.

## 2010-04-30 NOTE — Progress Notes (Signed)
Summary: REQ FOR REFILL (METHYLPHENIDATE)  Phone Note Call from Patient   Caller: Patient  [phone] Reason for Call: Refill Medication Summary of Call: Pt called to req a refill on med: METHYLPHENIDATE HCL 10 MG TABS / 20 MG TABS..... Pt adv that he can be reached at [phone] when same is ready for p/u.  Initial call taken by: Debbra Riding,  July 05, 2009 2:48 PM  Follow-up for Phone Call        rx ready to pick up Follow-up by: Kern Reap CMA Duncan Dull),  July 06, 2009 9:58 AM    Prescriptions: METHYLPHENIDATE HCL 10 MG TABS (METHYLPHENIDATE HCL) take one tab two times a day  fill in two months  #60 x 0   Entered by:   Kern Reap CMA (AAMA)   Authorized by:   Roderick Pee MD   Signed by:   Kern Reap CMA (AAMA) on 07/06/2009   Method used:   Print then Give to Patient   RxID:   [id number] METHYLPHENIDATE HCL 10 MG TABS (METHYLPHENIDATE HCL) take one tab two times a day   fill in one month  #60 x 0   Entered by:   Kern Reap CMA (AAMA)   Authorized by:   Roderick Pee MD   Signed by:   Kern Reap CMA (AAMA) on 07/06/2009   Method used:   Print then Give to Patient   RxID:   [id number] METHYLPHENIDATE HCL 10 MG TABS (METHYLPHENIDATE HCL) take one tab two times a day  #60 x 0   Entered by:   Kern Reap CMA (AAMA)   Authorized by:   Roderick Pee MD   Signed by:   Kern Reap CMA (AAMA) on 07/06/2009   Method used:   Print then Give to Patient   RxID:   [id number] METHYLPHENIDATE HCL 20 MG TABS (METHYLPHENIDATE HCL) take one tab two times a day   fill in one month  #60 x 0   Entered by:   Kern Reap CMA (AAMA)   Authorized by:   Roderick Pee MD   Signed by:   Kern Reap CMA (AAMA) on 07/06/2009   Method used:   Print then Give to Patient   RxID:   [id number] METHYLPHENIDATE HCL 20 MG TABS (METHYLPHENIDATE HCL) take one tab two times a day   fill in two months  #60 x 0   Entered by:   Kern Reap CMA (AAMA)  Authorized by:   Roderick Pee MD   Signed by:   Kern Reap CMA (AAMA) on 07/06/2009   Method used:   Print then Give to Patient   RxID:   [id number] METHYLPHENIDATE HCL 20 MG TABS (METHYLPHENIDATE HCL) take one tab two times a day  #60 x 0   Entered by:   Kern Reap CMA (AAMA)   Authorized by:   Roderick Pee MD   Signed by:   Kern Reap CMA (AAMA) on 07/06/2009   Method used:   Print then Give to Patient   RxID:   [id number]

## 2010-04-30 NOTE — Medication Information (Signed)
Summary: PA and Approval for Dextropamhetamine-Amph   PA and Approval for Dextropamhetamine-Amph   Imported By: Maryln Gottron 01/28/2010 13:37:20  _____________________________________________________________________  External Attachment:    Type:   Image     Comment:   External Document

## 2010-04-30 NOTE — Progress Notes (Signed)
Summary: med side effect  Phone Note Call from Patient Call back at Home Phone [phone]   Caller:  Patient Summary of Call: pt stated lisinopril has side effect coughing a lot please advise. Initial call taken by: Heron Sabins,  February 11, 2010 12:55 PM  Follow-up for Phone Call        Fleet Contras please call........ stop the lisinopril....Marland KitchenMarland Kitchen begin Norvasc 5 mg q.a.m. dispense 30 tabs........... BP checked daily in the morning.  Follow-up as previously outlined Follow-up by: Roderick Pee MD,  February 11, 2010 1:01 PM  Additional Follow-up for Phone Call Additional follow up Details #1::        Phone Call Completed Additional Follow-up by: Kern Reap CMA Duncan Dull),  February 11, 2010 3:43 PM    New/Updated Medications: NORVASC 5 MG TABS (AMLODIPINE BESYLATE) take one tab by mouth every morning Prescriptions: NORVASC 5 MG TABS (AMLODIPINE BESYLATE) take one tab by mouth every morning  #30 x 0   Entered by:   Kern Reap CMA (AAMA)   Authorized by:   Roderick Pee MD   Signed by:   Kern Reap CMA (AAMA) on 02/11/2010   Method used:   Electronically to        [facility]  [phone]* (retail)       [address] [place], [place]  [address]       Ph: [phone] or [phone]       Fax: [phone]   RxID:   [id number]

## 2010-04-30 NOTE — Progress Notes (Signed)
Summary: wellbutrin refill  Phone Note Call from Patient   Summary of Call: patient is requesting a refill of his wellbutrin Initial call taken by: Kern Reap CMA (AAMA),  September 14, 2009 10:12 AM    New/Updated Medications: BUDEPRION XL 300 MG XR24H-TAB (BUPROPION HCL) take one tab by mouth once daily Prescriptions: BUDEPRION XL 300 MG XR24H-TAB (BUPROPION HCL) take one tab by mouth once daily  #90 x 0   Entered by:   Kern Reap CMA (AAMA)   Authorized by:   Roderick Pee MD   Signed by:   Kern Reap CMA (AAMA) on 09/14/2009   Method used:   Electronically to        [facility]  [phone]* (retail)       [address] [place], [place]  [address]       Ph: [phone] or [phone]       Fax: [phone]   RxID:   [id number]

## 2010-04-30 NOTE — Progress Notes (Signed)
Summary: REFILL REQUEST  Phone Note Refill Request Message from:  Patient on December 27, 2009 2:45 PM  Refills Requested: Medication #1:  PRAVASTATIN SODIUM 40 MG TABS take one tab at bedtime   [facility].... Pt has appt for CPX on 10/13.  Medication #2:  CHLORTHALIDONE 25 MG TABS Take 1 tablet by mouth every morning   [facility] [place] [facility].... Pt has appt for CPX on 10/13.    Initial call taken by: Debbra Riding,  December 27, 2009 2:47 PM    Prescriptions: CHLORTHALIDONE 25 MG TABS (CHLORTHALIDONE) Take 1 tablet by mouth every morning  #90 x 3   Entered by:   Kern Reap CMA (AAMA)   Authorized by:   Roderick Pee MD   Signed by:   Kern Reap CMA (AAMA) on 12/27/2009   Method used:   Electronically to        [facility]  [phone]* (retail)       [address] [place], [place]  [address]       Ph: [phone] or [phone]       Fax: [phone]   RxID:   [id number] PRAVASTATIN SODIUM 40 MG TABS (PRAVASTATIN SODIUM) take one tab at bedtime  #90 x 3   Entered by:   Kern Reap CMA (AAMA)   Authorized by:   Roderick Pee MD   Signed by:   Kern Reap CMA (AAMA) on 12/27/2009   Method used:   Electronically to        [facility]  [phone]* (retail)       [address] [place], [place]  [address]       Ph: [phone] or [phone]       Fax: [phone]   RxID:   [id number]

## 2010-04-30 NOTE — Progress Notes (Signed)
Summary: NEW RX  Phone Note Refill Request Call back at Home Phone [phone] Message from:  Patient  Refills Requested: Medication #1:  METHYLPHENIDATE HCL 20 MG TABS take one tab two times a day PT NEEDS NEW RX PT HAS ONLY 2 PILLS LEFT  Initial call taken by: Heron Sabins,  January 04, 2010 1:00 PM  Follow-up for Phone Call        Fleet Contras please call patient explained two weeks prior to being out of his medication.  He needs to call........... not 2   day's Follow-up by: Roderick Pee MD,  January 07, 2010 8:18 AM    Prescriptions: METHYLPHENIDATE HCL 20 MG TABS (METHYLPHENIDATE HCL) take one tab two times a day  #60 x 0   Entered by:   Lynann Beaver CMA   Authorized by:   Roderick Pee MD   Signed by:   Lynann Beaver CMA on 01/07/2010   Method used:   Print then Give to Patient   RxID:   [id number]

## 2010-05-02 NOTE — Progress Notes (Signed)
Summary: REFILL REQUEST  Phone Note Refill Request Message from:  Patient on March 21, 2010 9:06 AM  Refills Requested: Medication #1:  ADDERALL XR 30 MG XR24H-CAP Take 1 tablet by mouth two times a day   Notes: Pt can be reached at [phone] when Rx is ready for p/u.    Initial call taken by: Debbra Riding,  March 21, 2010 9:07 AM    New/Updated Medications: ADDERALL XR 30 MG XR24H-CAP (AMPHETAMINE-DEXTROAMPHETAMINE) Take 1 tablet by mouth two times a day fill now ADDERALL XR 30 MG XR24H-CAP (AMPHETAMINE-DEXTROAMPHETAMINE) take one tab by mouth two times a day   fill in one month ADDERALL XR 30 MG XR24H-CAP (AMPHETAMINE-DEXTROAMPHETAMINE) take one tab by mouth two times a day   fill in two months Prescriptions: ADDERALL XR 30 MG XR24H-CAP (AMPHETAMINE-DEXTROAMPHETAMINE) take one tab by mouth two times a day   fill in two months  #60 x 0   Entered by:   Kern Reap CMA (AAMA)   Authorized by:   Roderick Pee MD   Signed by:   Kern Reap CMA (AAMA) on 03/21/2010   Method used:   Print then Give to Patient   RxID:   [id number] ADDERALL XR 30 MG XR24H-CAP (AMPHETAMINE-DEXTROAMPHETAMINE) take one tab by mouth two times a day   fill in one month  #60 x 0   Entered by:   Kern Reap CMA (AAMA)   Authorized by:   Roderick Pee MD   Signed by:   Kern Reap CMA (AAMA) on 03/21/2010   Method used:   Print then Give to Patient   RxID:   [id number] ADDERALL XR 30 MG XR24H-CAP (AMPHETAMINE-DEXTROAMPHETAMINE) Take 1 tablet by mouth two times a day fill now  #60 x 0   Entered by:   Kern Reap CMA (AAMA)   Authorized by:   Roderick Pee MD   Signed by:   Kern Reap CMA (AAMA) on 03/21/2010   Method used:   Print then Give to Patient   RxID:   [id number]

## 2010-05-02 NOTE — Medication Information (Signed)
Summary: PA & Approval for Methylphenidate Hcl  PA & Approval for Methylphenidate Hcl   Imported By: Maryln Gottron 04/03/2010 14:08:58  _____________________________________________________________________  External Attachment:    Type:   Image     Comment:   External Document

## 2010-05-02 NOTE — Progress Notes (Signed)
Summary: Pt needs new script written for Adderall XR.Notify pt when ready  Phone Note Call from Patient Call back at [phone] [facility]: wife - Pieter Partridge Summary of Call: Pt called and said that pt needs new script for Adderall XR generic. Pt said that old med was sent to pharmacy in error. Pls write new script for Adderall XR and notify pt when ready for pick up.  Initial call taken by: Lucy Antigua,  April 10, 2010 9:42 AM  Follow-up for Phone Call        ok x 3 mo ??????????lastt cpx????? Follow-up by: Roderick Pee MD,  April 11, 2010 11:17 AM  Additional Follow-up for Phone Call Additional follow up Details #1::        Left message on machine that we gave him adderall xr generic on 03/21/10. Told pt that she needs to call the pharmacy to see what is going on. Additional Follow-up by: Romualdo Bolk, CMA (AAMA),  April 11, 2010 11:35 AM

## 2010-05-14 ENCOUNTER — Other Ambulatory Visit: Payer: Self-pay | Admitting: Family Medicine

## 2010-07-11 ENCOUNTER — Other Ambulatory Visit: Payer: Self-pay | Admitting: Family Medicine

## 2010-07-11 MED ORDER — AMPHETAMINE-DEXTROAMPHET ER 30 MG PO CP24: 30.0000 mg | ORAL_CAPSULE | Freq: Two times a day (BID) | ORAL | Status: AC

## 2010-07-11 NOTE — Telephone Encounter (Signed)
Rx ready for pick up. patient  Is aware 

## 2010-07-11 NOTE — Telephone Encounter (Signed)
-   Refill Adderall

## 2010-08-12 ENCOUNTER — Other Ambulatory Visit: Payer: Self-pay | Admitting: Family Medicine

## 2010-09-11 ENCOUNTER — Other Ambulatory Visit: Payer: Self-pay | Admitting: Family Medicine

## 2010-10-10 ENCOUNTER — Other Ambulatory Visit: Payer: Self-pay | Admitting: Family Medicine

## 2010-10-21 ENCOUNTER — Other Ambulatory Visit: Payer: Self-pay | Admitting: Family Medicine

## 2010-10-21 NOTE — Telephone Encounter (Signed)
Pt requesting refill on amphetamine-dextroamphetamine (ADDERALL XR, 30MG ,) 30 MG 24 hr

## 2010-10-22 MED ORDER — AMPHETAMINE-DEXTROAMPHET ER 30 MG PO CP24: 30.0000 mg | ORAL_CAPSULE | Freq: Two times a day (BID) | ORAL | Status: AC

## 2010-10-22 NOTE — Telephone Encounter (Signed)
rx ready for pick up and patient is aware  

## 2010-10-22 NOTE — Telephone Encounter (Signed)
Pt called to check on status of Adderall xr 30 mg. Pt is leaving on Thursday to go out of town and would like to pick up script on Wednesday before he leaves.

## 2010-10-29 ENCOUNTER — Encounter: Payer: Self-pay | Admitting: *Deleted

## 2010-11-12 ENCOUNTER — Telehealth: Payer: Self-pay | Admitting: Family Medicine

## 2010-11-12 NOTE — Telephone Encounter (Signed)
Pt received a 90 day prescription of amphetamine-dextroamphetamine (ADDERALL XR, 30MG ,) 30 MG 24 on 10/22/10 and had misplaced the last 2 30 day prescriptions and is asking to get a copy of the last 2 prescriptions so he can get them filled.

## 2010-11-13 NOTE — Telephone Encounter (Signed)
Sorry if you lose the prescription.  We are not allowed to refill it .

## 2010-11-13 NOTE — Telephone Encounter (Signed)
Left message on machine for patient

## 2010-11-26 ENCOUNTER — Telehealth: Payer: Self-pay | Admitting: *Deleted

## 2010-11-26 NOTE — Telephone Encounter (Signed)
Patient is calling because he is having a difficult time concentrating at work without his Adderall.  He would like to know if it is okay to come in for an office visit for more refills. (he lost his other prescriptions).

## 2010-11-26 NOTE — Telephone Encounter (Signed)
Again............. Not able to read and write prescriptions because they were lost until the time is due

## 2010-11-29 NOTE — Telephone Encounter (Signed)
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

## 2010-12-04 NOTE — Telephone Encounter (Signed)
Dr Tawanna Cooler has already answered as per 8/28 note.  As Dr Tawanna Cooler is his primary, I cannot go against his judgement on this.  Discuss with Dr Tawanna Cooler upon his return.

## 2010-12-04 NOTE — Telephone Encounter (Signed)
Pt was given 3 mnths rx for adderall on 10/22/10.  The first rx was filled however, he lost the last 2 scripts.  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  Was told Friday that Dr. Tawanna Cooler would not provide another rx until he is due which will be October.  Patient asking if anything can be done as he has stopped meds cold [place].  Please advise if there is anything we can do or if pt needs to wait until Dr. Tawanna Cooler returns to the office on Monday.

## 2010-12-04 NOTE — Telephone Encounter (Signed)
Pt notified, advised pt that I would speak with Dr. Tawanna Cooler about his concerns once he returns to the office on Monday.

## 2010-12-04 NOTE — Telephone Encounter (Signed)
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  He was given 3 mnth supply of medication

## 2012-03-31 DIAGNOSIS — E7800 Pure hypercholesterolemia, unspecified: Secondary | ICD-10-CM

## 2012-03-31 DIAGNOSIS — N320 Bladder-neck obstruction: Secondary | ICD-10-CM

## 2012-03-31 DIAGNOSIS — I499 Cardiac arrhythmia, unspecified: Secondary | ICD-10-CM

## 2012-03-31 DIAGNOSIS — N50819 Testicular pain, unspecified: Secondary | ICD-10-CM

## 2012-03-31 HISTORY — DX: Cardiac arrhythmia, unspecified: I49.9

## 2012-03-31 HISTORY — DX: Bladder-neck obstruction: N32.0

## 2012-03-31 HISTORY — DX: Testicular pain, unspecified: N50.819

## 2012-03-31 HISTORY — DX: Pure hypercholesterolemia, unspecified: E78.00

## 2016-03-31 DIAGNOSIS — R0789 Other chest pain: Secondary | ICD-10-CM

## 2016-03-31 HISTORY — DX: Other chest pain: R07.89

## 2016-04-03 DIAGNOSIS — E785 Hyperlipidemia, unspecified: Secondary | ICD-10-CM | POA: Insufficient documentation

## 2016-04-03 DIAGNOSIS — R079 Chest pain, unspecified: Secondary | ICD-10-CM | POA: Insufficient documentation

## 2016-04-03 DIAGNOSIS — N529 Male erectile dysfunction, unspecified: Secondary | ICD-10-CM | POA: Insufficient documentation

## 2016-04-03 DIAGNOSIS — I10 Essential (primary) hypertension: Secondary | ICD-10-CM | POA: Insufficient documentation

## 2016-04-03 DIAGNOSIS — G4733 Obstructive sleep apnea (adult) (pediatric): Secondary | ICD-10-CM | POA: Insufficient documentation

## 2016-04-03 DIAGNOSIS — F329 Major depressive disorder, single episode, unspecified: Secondary | ICD-10-CM | POA: Insufficient documentation

## 2016-04-15 HISTORY — PX: CARDIOVASCULAR STRESS TEST: SHX262

## 2019-05-04 ENCOUNTER — Ambulatory Visit: Payer: BC Managed Care – PPO | Attending: Internal Medicine

## 2019-05-04 DIAGNOSIS — Z20822 Contact with and (suspected) exposure to covid-19: Secondary | ICD-10-CM | POA: Insufficient documentation

## 2019-05-05 LAB — NOVEL CORONAVIRUS, NAA: SARS-CoV-2, NAA: NOT DETECTED

## 2019-05-28 ENCOUNTER — Ambulatory Visit: Payer: BC Managed Care – PPO | Attending: Internal Medicine

## 2019-05-28 DIAGNOSIS — Z23 Encounter for immunization: Secondary | ICD-10-CM | POA: Insufficient documentation

## 2019-05-28 NOTE — Progress Notes (Signed)
   Covid-19 Vaccination Clinic  Name:  Gary Rose    MRN: [id number] DOB: 1961/01/28  05/28/2019  [name] was observed post Covid-19 immunization for 15 minutes without incidence. He was provided with Vaccine Information Sheet and instruction to access the V-Safe system.   [name] was instructed to call 911 with any severe reactions post vaccine: Marland Kitchen Difficulty breathing  . Swelling of your face and throat  . A fast heartbeat  . A bad rash all over your body  . Dizziness and weakness    Immunizations Administered    Name Date Dose VIS Date Route   Pfizer COVID-19 Vaccine 05/28/2019  2:00 PM 0.3 mL 03/11/2019 Intramuscular   Manufacturer: [place]   Lot: [id number]   [place]: [id number]

## 2019-06-18 ENCOUNTER — Ambulatory Visit: Payer: BC Managed Care – PPO | Attending: Internal Medicine

## 2019-06-18 DIAGNOSIS — Z23 Encounter for immunization: Secondary | ICD-10-CM

## 2019-06-18 NOTE — Progress Notes (Signed)
   Covid-19 Vaccination Clinic  Name:  Gary Rose    MRN: [id number] DOB: March 27, 1961  06/18/2019  Mr. Gary Rose was observed post Covid-19 immunization for 15 minutes without incident. He was provided with Vaccine Information Sheet and instruction to access the V-Safe system.   Mr. Gary Rose was instructed to call 911 with any severe reactions post vaccine: Marland Kitchen Difficulty breathing  . Swelling of face and throat  . A fast heartbeat  . A bad rash all over body  . Dizziness and weakness   Immunizations Administered    Name Date Dose VIS Date Route   Pfizer COVID-19 Vaccine 06/18/2019  8:18 AM 0.3 mL 03/11/2019 Intramuscular   Manufacturer: [place]   Lot: [id number]   [place]: [id number]

## 2019-07-13 ENCOUNTER — Ambulatory Visit: Payer: BC Managed Care – PPO | Attending: Internal Medicine

## 2019-07-13 DIAGNOSIS — Z20822 Contact with and (suspected) exposure to covid-19: Secondary | ICD-10-CM

## 2019-07-14 LAB — SARS-COV-2, NAA 2 DAY TAT

## 2019-07-14 LAB — NOVEL CORONAVIRUS, NAA: SARS-CoV-2, NAA: NOT DETECTED

## 2020-02-03 ENCOUNTER — Other Ambulatory Visit: Payer: BC Managed Care – PPO

## 2020-02-03 DIAGNOSIS — Z20822 Contact with and (suspected) exposure to covid-19: Secondary | ICD-10-CM

## 2020-02-05 LAB — NOVEL CORONAVIRUS, NAA: SARS-CoV-2, NAA: NOT DETECTED

## 2020-02-05 LAB — SARS-COV-2, NAA 2 DAY TAT

## 2021-03-31 DIAGNOSIS — N529 Male erectile dysfunction, unspecified: Secondary | ICD-10-CM

## 2021-03-31 DIAGNOSIS — R3912 Poor urinary stream: Secondary | ICD-10-CM

## 2021-03-31 HISTORY — DX: Poor urinary stream: R39.12

## 2021-03-31 HISTORY — DX: Male erectile dysfunction, unspecified: N52.9

## 2021-10-04 HISTORY — PX: PROSTATE BIOPSY: SHX241

## 2021-10-29 HISTORY — PX: COLONOSCOPY: SHX174

## 2021-12-03 ENCOUNTER — Telehealth: Payer: Self-pay | Admitting: Radiation Oncology

## 2021-12-03 NOTE — Telephone Encounter (Signed)
LVM to sched CON with Dr. Tammi Klippel next available.

## 2021-12-05 ENCOUNTER — Telehealth: Payer: Self-pay | Admitting: Radiation Oncology

## 2021-12-05 NOTE — Telephone Encounter (Signed)
LVM to sched consult with Dr. 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

## 2021-12-05 NOTE — Telephone Encounter (Signed)
LVM to sched CON with Dr. Manning 

## 2021-12-10 NOTE — Progress Notes (Signed)
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 On occasion has discomfort.  SAFETY ISSUES: Prior radiation? No Pacemaker/ICD? No Possible current pregnancy? Male Is the patient on methotrexate? No  Current Complaints / other details:

## 2021-12-18 ENCOUNTER — Other Ambulatory Visit: Payer: Self-pay

## 2021-12-18 ENCOUNTER — Ambulatory Visit
Admission: RE | Admit: 2021-12-18 | Discharge: 2021-12-18 | Disposition: A | Discharge status: Home / Self Care | Payer: BC Managed Care – PPO | Source: Ambulatory Visit | Attending: Radiation Oncology | Admitting: Radiation Oncology

## 2021-12-18 ENCOUNTER — Encounter: Payer: Self-pay | Admitting: Radiation Oncology

## 2021-12-18 VITALS — BP 116/78 | HR 77 | Temp 97.6°F | Resp 18 | Ht 66.0 in | Wt 193.6 lb

## 2021-12-18 DIAGNOSIS — Z79899 Other long term (current) drug therapy: Secondary | ICD-10-CM | POA: Diagnosis not present

## 2021-12-18 DIAGNOSIS — K219 Gastro-esophageal reflux disease without esophagitis: Secondary | ICD-10-CM | POA: Diagnosis not present

## 2021-12-18 DIAGNOSIS — E7800 Pure hypercholesterolemia, unspecified: Secondary | ICD-10-CM | POA: Insufficient documentation

## 2021-12-18 DIAGNOSIS — C61 Malignant neoplasm of prostate: Secondary | ICD-10-CM | POA: Diagnosis present

## 2021-12-18 DIAGNOSIS — Z800 Family history of malignant neoplasm of digestive organs: Secondary | ICD-10-CM | POA: Diagnosis not present

## 2021-12-18 HISTORY — DX: Gastro-esophageal reflux disease without esophagitis: K21.9

## 2021-12-18 HISTORY — DX: Depression, unspecified: F32.A

## 2021-12-18 HISTORY — DX: Anxiety disorder, unspecified: F41.9

## 2021-12-18 HISTORY — DX: Elevated prostate specific antigen (PSA): R97.20

## 2021-12-18 HISTORY — DX: Unspecified asthma, uncomplicated: J45.909

## 2021-12-18 NOTE — Progress Notes (Signed)
Radiation Oncology         [phone] ________________________________  Initial Outpatient Consultation  Name: Gary Rose MRN: [id number]  Date: 12/18/2021  DOB: Mar 24, 1961  BT:DVVOH, [name], MD  Franchot Gallo, MD   REFERRING PHYSICIAN: Franchot Gallo, MD  DIAGNOSIS: 61 y.o. gentleman with Stage T1c adenocarcinoma of the prostate with Gleason score of 3+4, and PSA of 4.12.    ICD-10-CM   1. Malignant neoplasm of prostate ([place])  C61       HISTORY OF PRESENT ILLNESS: Gary Rose is a 61 y.o. male with a diagnosis of prostate cancer. He was noted to have an elevated PSA of 4.54 on labs by his primary care physician, Dr. Ernie Hew in 02/2021.  Accordingly, he was referred for evaluation in urology by Dr. Diona Fanti on 05/08/21,  digital rectal examination was performed at that time revealing no nodularity or concerning findings.  A repeat PSA performed at that time remained elevated at 4.12.  Therefore, the patient proceeded to transrectal ultrasound with 12 biopsies of the prostate on 10/04/2021.  The prostate volume measured 37.6 cc.  Out of 12 core biopsies, 5 were positive.  The maximum Gleason score was 3+4, and this was seen in the right mid, right mid lateral, right apex and right apex lateral.  Additionally, there was Gleason 3+3 in the left apex.  The patient reviewed the biopsy results with his urologist and he has kindly been referred today for discussion of potential radiation treatment options.   PREVIOUS RADIATION THERAPY: No  PAST MEDICAL HISTORY:  Past Medical History:  Diagnosis Date   Anxiety    Arrhythmia 2014   Asthma    Depression    Elevated PSA    Erectile dysfunction 2023   GERD (gastroesophageal reflux disease)    Hypercholesterolemia 2014   Stenosis of bladder neck 2014   Testicular pain 2014   Weak urinary stream 2023      PAST SURGICAL HISTORY: Past Surgical History:  Procedure Laterality Date   CHOLECYSTECTOMY     PROSTATE  BIOPSY N/A 10/04/2021    FAMILY HISTORY:  Family History  Problem Relation Age of Onset   Brain cancer Mother    Alzheimer's disease Father     SOCIAL HISTORY:  Social History   Socioeconomic History   Marital status: Married    Spouse name: Not on file   Number of children: Not on file   Years of education: Not on file   Highest education level: Not on file  Occupational History   Not on file  Tobacco Use   Smoking status: Never   Smokeless tobacco: Never  Substance and Sexual Activity   Alcohol use: Yes   Drug use: Never   Sexual activity: Not on file  Other Topics Concern   Not on file  Social History Narrative   Not on file   Social Determinants of Health   Financial Resource Strain: Not on file  Food Insecurity: Not on file  Transportation Needs: Not on file  Physical Activity: Not on file  Stress: Not on file  Social Connections: Not on file  Intimate Partner Violence: Not on file    ALLERGIES: Patient has no known allergies.  MEDICATIONS:  Current Outpatient Medications  Medication Sig Dispense Refill   Multiple Vitamin (MULTIVITAMIN) tablet Take 1 tablet by mouth daily.     pravastatin (PRAVACHOL) 40 MG tablet Take by mouth.     alfuzosin (UROXATRAL) 10 MG 24 hr tablet Take 10 mg  by mouth daily.     ALPRAZolam (XANAX) 0.25 MG tablet Take 0.25 mg by mouth 2 (two) times daily as needed.     amLODipine (NORVASC) 10 MG tablet Take 10 mg by mouth daily.     buPROPion (WELLBUTRIN XL) 300 MG 24 hr tablet TAKE ONE TAB BY MOUTH ONCE DAILY 90 tablet 2   DUPIXENT 300 MG/2ML prefilled syringe Inject into the skin.     losartan-hydrochlorothiazide (HYZAAR) 100-25 MG tablet Take 1 tablet by mouth daily.     montelukast (SINGULAIR) 10 MG tablet Take 10 mg by mouth daily.     sildenafil (VIAGRA) 100 MG tablet Take 100 mg by mouth daily as needed.     tadalafil (CIALIS) 20 MG tablet Take 20 mg by mouth as needed.     tamsulosin (FLOMAX) 0.4 MG CAPS capsule Take 0.4  mg by mouth daily.     venlafaxine XR (EFFEXOR-XR) 75 MG 24 hr capsule Take 75 mg by mouth daily.     No current facility-administered medications for this encounter.    REVIEW OF SYSTEMS:  On review of systems, the patient reports that he is doing well overall. He denies any chest pain, shortness of breath, cough, fevers, chills, night sweats, unintended weight changes. He denies any bowel disturbances, and denies abdominal pain, nausea or vomiting. He denies any new musculoskeletal or joint aches or pains. His IPSS was 8, indicating mild urinary symptoms with a weak flow of stream, intermittency and nocturia x2.  He is taking Flomax daily which significantly helps with his urinary symptoms.  His SHIM was 16, indicating he has mild erectile dysfunction. A complete review of systems is obtained and is otherwise negative.   PHYSICAL EXAM:  Wt Readings from Last 3 Encounters:  12/18/21 193 lb 9.6 oz (87.8 kg)   Temp Readings from Last 3 Encounters:  12/18/21 97.6 F (36.4 C)   BP Readings from Last 3 Encounters:  12/18/21 116/78  01/17/10 (!) 130/92   Pulse Readings from Last 3 Encounters:  12/18/21 77   Pain Assessment Pain Score: 0-No pain Pain Loc: Leg (left)/10  In general this is a well appearing Caucasian male in no acute distress. He's alert and oriented x4 and appropriate throughout the examination. Cardiopulmonary assessment is negative for acute distress, and he exhibits normal effort.     KPS = 100  100 - Normal; no complaints; no evidence of disease. 90   - Able to carry on normal activity; minor signs or symptoms of disease. 80   - Normal activity with effort; some signs or symptoms of disease. 49   - Cares for self; unable to carry on normal activity or to do active work. 60   - Requires occasional assistance, but is able to care for most of his personal needs. 50   - Requires considerable assistance and frequent medical care. 40   - Disabled; requires special care  and assistance. 30   - Severely disabled; hospital admission is indicated although death not imminent. 20   - Very sick; hospital admission necessary; active supportive treatment necessary. 10   - Moribund; fatal processes progressing rapidly. 0     - Dead  Karnofsky DA, Abelmann WH, Craver LS and [facility] [phone]) The use of the nitrogen mustards in the palliative treatment of carcinoma: with particular reference to bronchogenic carcinoma Cancer 1 634-56  LABORATORY DATA:  Lab Results  Component Value Date   WBC 7.3 12/11/2009   HGB 15.3 12/11/2009  HCT 44.3 12/11/2009   MCV 89.8 12/11/2009   PLT 290.0 12/11/2009   Lab Results  Component Value Date   NA 144 12/11/2009   K 3.1 (L) 12/11/2009   CL 100 12/11/2009   CO2 35 (H) 12/11/2009   Lab Results  Component Value Date   ALT 43 12/11/2009   AST 30 12/11/2009   ALKPHOS 72 12/11/2009   BILITOT 0.7 12/11/2009     RADIOGRAPHY: No results found.    IMPRESSION/PLAN: 1. 61 y.o. gentleman with Stage T1c adenocarcinoma of the prostate with Gleason Score of 3+4, and PSA of 4.12. We discussed the patient's workup and outlined the nature of prostate cancer in this setting. The patient's T stage, Gleason's score, and PSA put him into the favorable intermediate risk group. Accordingly, he is eligible for a variety of potential treatment options including brachytherapy, 5.5 weeks of external radiation, or prostatectomy. We discussed the available radiation techniques, and focused on the details and logistics of delivery. We discussed and outlined the risks, benefits, short and long-term effects associated with radiotherapy and compared and contrasted these with prostatectomy. We discussed the role of SpaceOAR gel in reducing the rectal toxicity associated with radiotherapy. He appears to have a good understanding of his disease and our treatment recommendations which are of curative intent.  He was encouraged to ask questions that were  answered to his stated satisfaction.  At the conclusion of our conversation, the patient is interested in moving forward with brachytherapy and use of SpaceOAR gel to reduce rectal toxicity from radiotherapy.  We will share our discussion with Dr. Diona Fanti and move forward with scheduling his [facility] planning appointment in the near future. The patient met briefly with Romie Jumper in our office who will be working closely with him to coordinate OR scheduling and pre and post procedure appointments.  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 We will contact the pharmaceutical rep to ensure that Blanco is available at the time of procedure.  We enjoyed meeting him today and look forward to continuing to participate in his care.  We personally spent 70 minutes in this encounter including chart review, reviewing radiological studies, meeting face-to-face with the patient, entering orders and completing documentation.    Nicholos Johns, PA-C    Tyler Pita, MD  [facility] Direct Dial: [phone]  Fax: [phone] [email or web address]  Skype  LinkedIn

## 2021-12-24 ENCOUNTER — Telehealth: Payer: Self-pay | Admitting: *Deleted

## 2021-12-24 NOTE — Telephone Encounter (Signed)
Returned patient's phone call, lvm for a return call 

## 2021-12-26 ENCOUNTER — Other Ambulatory Visit (HOSPITAL_COMMUNITY): Payer: BC Managed Care – PPO

## 2021-12-26 ENCOUNTER — Telehealth: Payer: Self-pay | Admitting: *Deleted

## 2021-12-26 NOTE — Telephone Encounter (Signed)
CALLED PATIENT TO INFORM OF DATES, LVM FOR A RETURN CALL

## 2021-12-27 ENCOUNTER — Telehealth: Payer: Self-pay | Admitting: *Deleted

## 2021-12-27 NOTE — Telephone Encounter (Signed)
Returned patient's phone call, lvm for a return call 

## 2022-02-06 ENCOUNTER — Other Ambulatory Visit: Payer: Self-pay | Admitting: Urology

## 2022-02-25 ENCOUNTER — Telehealth: Payer: Self-pay | Admitting: *Deleted

## 2022-02-25 NOTE — Telephone Encounter (Signed)
Called patient to remind of pre-seed appts, for 02-27-22, lvm for a return call

## 2022-02-26 ENCOUNTER — Telehealth: Payer: Self-pay | Admitting: *Deleted

## 2022-02-26 NOTE — Telephone Encounter (Signed)
CALLED PATIENT TO INFORM THAT PRE-SEED APPTS. HAVE BEEN CHANGED TO 03-06-22, LVM FOR A RETURN CALL

## 2022-02-27 ENCOUNTER — Encounter (HOSPITAL_COMMUNITY): Admission: RE | Admit: 2022-02-27 | Payer: BC Managed Care – PPO | Source: Ambulatory Visit

## 2022-02-27 ENCOUNTER — Telehealth: Payer: Self-pay | Admitting: *Deleted

## 2022-02-27 ENCOUNTER — Ambulatory Visit: Payer: BC Managed Care – PPO | Admitting: Urology

## 2022-02-27 ENCOUNTER — Ambulatory Visit: Payer: BC Managed Care – PPO | Admitting: Radiation Oncology

## 2022-02-27 NOTE — Telephone Encounter (Signed)
CALLED PATIENT TO INFORM OF PRE-SEED APPTS. BEING MOVED TO 03-06-22, LVM FOR A RETURN CALL

## 2022-03-04 ENCOUNTER — Telehealth: Payer: Self-pay | Admitting: *Deleted

## 2022-03-04 NOTE — Telephone Encounter (Signed)
RETURNED PATIENT'S PHONE CALL, LVM FOR A RETURN CALL 

## 2022-03-06 ENCOUNTER — Ambulatory Visit: Payer: BC Managed Care – PPO | Admitting: Radiation Oncology

## 2022-03-06 ENCOUNTER — Ambulatory Visit: Payer: BC Managed Care – PPO | Admitting: Urology

## 2022-03-06 ENCOUNTER — Inpatient Hospital Stay (HOSPITAL_COMMUNITY): Admission: RE | Admit: 2022-03-06 | Payer: BC Managed Care – PPO | Source: Ambulatory Visit

## 2022-03-07 ENCOUNTER — Telehealth: Payer: Self-pay | Admitting: *Deleted

## 2022-03-07 NOTE — Telephone Encounter (Signed)
CALLED PATIENT TO INFORM OF PRE-SEED APPTS. FOR 03-14-22 SPOKE WITH PATIENT AND HE IS AWARE OF THESE APPTS.

## 2022-03-13 ENCOUNTER — Telehealth: Payer: Self-pay | Admitting: *Deleted

## 2022-03-13 NOTE — Telephone Encounter (Signed)
CALLED PATIENT TO REMIND OF PRE-SEED APPTS. FOR 03-14-22, LVM FOR A RETURN CALL

## 2022-03-14 ENCOUNTER — Ambulatory Visit
Admission: RE | Admit: 2022-03-14 | Discharge: 2022-03-14 | Disposition: A | Discharge status: Home / Self Care | Payer: BC Managed Care – PPO | Source: Ambulatory Visit | Attending: Urology | Admitting: Urology

## 2022-03-14 ENCOUNTER — Other Ambulatory Visit: Payer: Self-pay

## 2022-03-14 ENCOUNTER — Encounter: Payer: Self-pay | Admitting: Urology

## 2022-03-14 ENCOUNTER — Encounter (HOSPITAL_COMMUNITY)
Admission: RE | Admit: 2022-03-14 | Discharge: 2022-03-14 | Disposition: A | Discharge status: Home / Self Care | Payer: BC Managed Care – PPO | Source: Ambulatory Visit | Attending: Urology | Admitting: Urology

## 2022-03-14 ENCOUNTER — Ambulatory Visit
Admission: RE | Admit: 2022-03-14 | Discharge: 2022-03-14 | Disposition: A | Discharge status: Home / Self Care | Payer: BC Managed Care – PPO | Source: Ambulatory Visit | Attending: Radiation Oncology | Admitting: Radiation Oncology

## 2022-03-14 VITALS — Resp 20 | Ht 66.0 in | Wt 188.0 lb

## 2022-03-14 DIAGNOSIS — C61 Malignant neoplasm of prostate: Secondary | ICD-10-CM

## 2022-03-14 DIAGNOSIS — I10 Essential (primary) hypertension: Secondary | ICD-10-CM | POA: Insufficient documentation

## 2022-03-14 DIAGNOSIS — Z01810 Encounter for preprocedural cardiovascular examination: Secondary | ICD-10-CM | POA: Insufficient documentation

## 2022-03-14 NOTE — Progress Notes (Signed)
Radiation Oncology         [phone] ________________________________  Outpatient Follow up- Pre-seed visit  Name: Gary Rose MRN: [id number]  Date: 03/14/2022  DOB: 01-11-1961  BW:LSLHT, Gary Rose, Gary  Gary Rose, Gary   REFERRING PHYSICIAN: Fanny Rose, Gary  DIAGNOSIS: 61 y.o. gentleman with Stage T1c adenocarcinoma of the prostate with Gleason score of 3+4, and PSA of 4.12.     ICD-10-CM   1. Malignant neoplasm of prostate ([place])  C61       HISTORY OF PRESENT ILLNESS: Gary Rose is a 61 y.o. male with a diagnosis of prostate cancer.  He was noted to have an elevated PSA of 4.54 on labs by his primary care physician, Dr. Ernie Rose in 02/2021.  Accordingly, he was referred for evaluation in urology by Dr. Diona Rose on 05/08/21,  digital rectal examination was performed at that time revealing no nodularity or concerning findings.  A repeat PSA performed at that time remained elevated at 4.12. Therefore, the patient proceeded to transrectal ultrasound with 12 biopsies of the prostate on 10/04/2021.  The prostate volume measured 37.6 cc.  Out of 12 core biopsies, 5 were positive.  The maximum Gleason score was 3+4, and this was seen in the right mid, right mid lateral, right apex and right apex lateral.  Additionally, there was Gleason 3+3 in the left apex.   The patient reviewed the biopsy results with his urologist and was kindly referred to [place] for discussion of potential radiation treatment options. We initially met the patient on 12/18/21 and he was most interested in proceeding with brachytherapy and SpaceOAR gel placement for treatment of his disease. He is here today for his pre-procedure imaging for planning and to answer any additional questions he may have about this treatment.   PREVIOUS RADIATION THERAPY: No  PAST MEDICAL HISTORY:  Past Medical History:  Diagnosis Date   Anxiety    Arrhythmia 2014   Asthma    Depression    Elevated PSA    Erectile  dysfunction 2023   GERD (gastroesophageal reflux disease)    Hypercholesterolemia 2014   Stenosis of bladder neck 2014   Testicular pain 2014   Weak urinary stream 2023      PAST SURGICAL HISTORY: Past Surgical History:  Procedure Laterality Date   CHOLECYSTECTOMY     PROSTATE BIOPSY N/A 10/04/2021    FAMILY HISTORY:  Family History  Problem Relation Age of Onset   Brain cancer Mother    Alzheimer's disease Father     SOCIAL HISTORY:  Social History   Socioeconomic History   Marital status: Married    Spouse name: Not on file   Number of children: Not on file   Years of education: Not on file   Highest education level: Not on file  Occupational History   Not on file  Tobacco Use   Smoking status: Never   Smokeless tobacco: Never  Substance and Sexual Activity   Alcohol use: Yes   Drug use: Never   Sexual activity: Not on file  Other Topics Concern   Not on file  Social History Narrative   Not on file   Social Determinants of Health   Financial Resource Strain: Not on file  Food Insecurity: Not on file  Transportation Needs: Not on file  Physical Activity: Not on file  Stress: Not on file  Social Connections: Not on file  Intimate Partner Violence: Not on file    ALLERGIES: Patient has  no known allergies.  MEDICATIONS:  Current Outpatient Medications  Medication Sig Dispense Refill   alfuzosin (UROXATRAL) 10 MG 24 hr tablet Take 10 mg by mouth daily.     ALPRAZolam (XANAX) 0.25 MG tablet Take 0.25 mg by mouth 2 (two) times daily as needed.     amLODipine (NORVASC) 10 MG tablet Take 10 mg by mouth daily.     buPROPion (WELLBUTRIN XL) 300 MG 24 hr tablet TAKE ONE TAB BY MOUTH ONCE DAILY 90 tablet 2   DUPIXENT 300 MG/2ML prefilled syringe Inject into the skin.     losartan-hydrochlorothiazide (HYZAAR) 100-25 MG tablet Take 1 tablet by mouth daily.     montelukast (SINGULAIR) 10 MG tablet Take 10 mg by mouth daily.     Multiple Vitamin (MULTIVITAMIN)  tablet Take 1 tablet by mouth daily.     pravastatin (PRAVACHOL) 40 MG tablet Take by mouth.     sildenafil (VIAGRA) 100 MG tablet Take 100 mg by mouth daily as needed.     tadalafil (CIALIS) 20 MG tablet Take 20 mg by mouth as needed.     tamsulosin (FLOMAX) 0.4 MG CAPS capsule Take 0.4 mg by mouth daily.     venlafaxine XR (EFFEXOR-XR) 75 MG 24 hr capsule Take 75 mg by mouth daily.     No current facility-administered medications for this visit.    REVIEW OF SYSTEMS:   On review of systems, the patient reports that he is doing well overall. He denies any chest pain, shortness of breath, cough, fevers, chills, night sweats, unintended weight changes. He denies any bowel disturbances, and denies abdominal pain, nausea or vomiting. He denies any new musculoskeletal or joint aches or pains. His IPSS was 8, indicating mild urinary symptoms with a weak flow of stream, intermittency and nocturia x2. He is taking Flomax daily which significantly helps with his urinary symptoms. His SHIM was 16, indicating he has mild erectile dysfunction. A complete review of systems is obtained and is otherwise negative.     PHYSICAL EXAM:  Wt Readings from Last 3 Encounters:  12/18/21 193 lb 9.6 oz (87.8 kg)   Temp Readings from Last 3 Encounters:  12/18/21 97.6 F (36.4 C)   BP Readings from Last 3 Encounters:  12/18/21 116/78  01/17/10 (!) 130/92   Pulse Readings from Last 3 Encounters:  12/18/21 77    /10  In general this is a well appearing Caucasian male in no acute distress. He's alert and oriented x4 and appropriate throughout the examination. Cardiopulmonary assessment is negative for acute distress, and he exhibits normal effort.     KPS = 100  100 - Normal; no complaints; no evidence of disease. 90   - Able to carry on normal activity; minor signs or symptoms of disease. 80   - Normal activity with effort; some signs or symptoms of disease. 81   - Cares for self; unable to carry on normal  activity or to do active work. 60   - Requires occasional assistance, but is able to care for most of his personal needs. 50   - Requires considerable assistance and frequent medical care. 58   - Disabled; requires special care and assistance. 33   - Severely disabled; hospital admission is indicated although death not imminent. 65   - Very sick; hospital admission necessary; active supportive treatment necessary. 10   - Moribund; fatal processes progressing rapidly. 0     - Dead  Karnofsky DA, Abelmann Gentry, Craver LS and Burchenal  [facility] (1948) The use of the nitrogen mustards in the palliative treatment of carcinoma: with particular reference to bronchogenic carcinoma Cancer 1 634-56  LABORATORY DATA:  Lab Results  Component Value Date   WBC 7.3 12/11/2009   HGB 15.3 12/11/2009   HCT 44.3 12/11/2009   MCV 89.8 12/11/2009   PLT 290.0 12/11/2009   Lab Results  Component Value Date   NA 144 12/11/2009   K 3.1 (L) 12/11/2009   CL 100 12/11/2009   CO2 35 (H) 12/11/2009   Lab Results  Component Value Date   ALT 43 12/11/2009   AST 30 12/11/2009   ALKPHOS 72 12/11/2009   BILITOT 0.7 12/11/2009     RADIOGRAPHY: No results found.    IMPRESSION/PLAN: 1. 61 y.o. gentleman with Stage T1c adenocarcinoma of the prostate with Gleason score of 3+4, and PSA of 4.12.  The patient has elected to proceed with seed implant for treatment of his disease. We reviewed the risks, benefits, short and long-term effects associated with brachytherapy and discussed the role of SpaceOAR in reducing the rectal toxicity associated with radiotherapy.  He appears to have a good understanding of his disease and our treatment recommendations which are of curative intent.  He was encouraged to ask questions that were answered to his stated satisfaction. He has freely signed written consent to proceed today in the office and a copy of this document will be placed in his medical record. His procedure is tentatively  scheduled for 04/18/22 in collaboration with Dr. Diona Rose and we will see him back for his post-procedure visit approximately 3 weeks thereafter. We look forward to continuing to participate in his care. He knows that he is welcome to call with any questions or concerns at any time in the interim.  I personally spent 30 minutes in this encounter including chart review, reviewing radiological studies, meeting face-to-face with the patient, entering orders and completing documentation.    Nicholos Johns, MMS, PA-C [facility] [place]: [phone]  Fax: [phone]

## 2022-03-14 NOTE — Progress Notes (Signed)
  Radiation Oncology         [phone] ________________________________  Name: PRECILIANO CASTELL MRN: [id number]  Date: 03/14/2022  DOB: Oct 26, 1960  SIMULATION AND TREATMENT PLANNING NOTE PUBIC ARCH STUDY  NT:IRWER, [name], MD  [name], MD  DIAGNOSIS: 61 y.o. gentleman with Stage T1c adenocarcinoma of the prostate with Gleason score of 3+4, and PSA of 4.12.   Oncology History  Malignant neoplasm of prostate ([place])  10/04/2021 Cancer Staging   Staging form: Prostate, AJCC 8th Edition - Clinical stage from 10/04/2021: Stage IIB (cT1c, cN0, cM0, PSA: 4.1, Grade Group: 2) - Signed by Freeman Caldron, PA-C on 12/18/2021 Histopathologic type: Adenocarcinoma, NOS Stage prefix: Initial diagnosis Prostate specific antigen (PSA) range: Less than 10 Gleason primary pattern: 3 Gleason secondary pattern: 4 Gleason score: 7 Histologic grading system: 5 grade system Number of biopsy cores examined: 12 Number of biopsy cores positive: 5 Location of positive needle core biopsies: Both sides   12/18/2021 Initial Diagnosis   Malignant neoplasm of prostate ([place])       ICD-10-CM   1. Malignant neoplasm of prostate ([place])  C61       COMPLEX SIMULATION:  The patient presented today for evaluation for possible prostate seed implant. He was brought to the radiation planning suite and placed supine on the CT couch. A 3-dimensional image study set was obtained in upload to the planning computer. There, on each axial slice, I contoured the prostate gland. Then, using three-dimensional radiation planning tools I reconstructed the prostate in view of the structures from the transperineal needle pathway to assess for possible pubic arch interference. In doing so, I did not appreciate any pubic arch interference. Also, the patient's prostate volume was estimated based on the drawn structure. The volume was 37 cc.  Given the pubic arch appearance and prostate volume, patient remains a good candidate  to proceed with prostate seed implant. Today, he freely provided informed written consent to proceed.    PLAN: The patient will undergo prostate seed implant.   ________________________________  Sheral Apley. Tammi Klippel, M.D.

## 2022-03-14 NOTE — Progress Notes (Signed)
Pre-seed nursing interview for a 61 y.o. gentleman with Stage T1c adenocarcinoma of the prostate with Gleason score of 3+4, and PSA of 4.12. I verified patient's identity and began nursing interview.  Patient is doing well. No issues reported at this time.  Meaningful use complete. Tamsulosin as directed. Urology appt- April 18, 2021 w/ Dr. Tamala Julian at [facility].  Resp 20   Ht '5\' 6"'$  (1.676 m)   Wt 188 lb (85.3 kg)   BMI 30.34 kg/m   This concludes the interview.   Leandra Kern, LPN

## 2022-03-27 DIAGNOSIS — Z01818 Encounter for other preprocedural examination: Secondary | ICD-10-CM

## 2022-03-27 DIAGNOSIS — I10 Essential (primary) hypertension: Secondary | ICD-10-CM

## 2022-04-14 ENCOUNTER — Encounter (HOSPITAL_BASED_OUTPATIENT_CLINIC_OR_DEPARTMENT_OTHER): Payer: Self-pay | Admitting: Urology

## 2022-04-15 ENCOUNTER — Telehealth: Payer: Self-pay | Admitting: *Deleted

## 2022-04-15 NOTE — Progress Notes (Signed)
Spoke with patient via telephone, patient had many questions about post procedure and other options. Informed patient that he would need to call Dr Diona Fanti to get his questions answered [facility] telephone number provided. Patient also stated he may be cancelling or re-scheduling. Patient advised to let surgery scheduler know if he planned to cancel/re-schedule.

## 2022-04-15 NOTE — Telephone Encounter (Signed)
RETURNED PATIENT'S PHONE CALL, SPOKE WITH PATIENT. ?

## 2022-04-15 NOTE — Progress Notes (Signed)
RN spoke with patient regarding upcoming brachytherapy.  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 RN was able to answer all questions and provide education on daily radiation.   Pt will review with his wife this evening.  RN encouraged patient to reach out with any additional questions.

## 2022-04-17 ENCOUNTER — Encounter: Payer: Self-pay | Admitting: Urology

## 2022-04-17 NOTE — Progress Notes (Signed)
Patient had pre-seed [facility] and was scheduled for brachytherapy 04/18/22 but has now decided that he prefers the daily IMRT so a request has been sent to Romie Jumper to proceed with coordinating the FM and SO placement, first available, in anticipation of beginning his daily radiation treatments in the near future. His brahcytherapy procedure has been cancelled and [place] and Tammi Klippel informed, to keep all providers in the loop.  Nicholos Johns, MMS, PA-C [place] [place]: [phone]  Fax: [phone]

## 2022-04-17 NOTE — Progress Notes (Signed)
Patient has decided to proceed with daily radiation vs brachytherapy.    RN left voicemail for patient to call back to provide education on daily radiation since change in treatment plan.    Plan of care in progress.

## 2022-04-18 ENCOUNTER — Ambulatory Visit (HOSPITAL_BASED_OUTPATIENT_CLINIC_OR_DEPARTMENT_OTHER): Admission: RE | Admit: 2022-04-18 | Payer: BC Managed Care – PPO | Source: Home / Self Care | Admitting: Urology

## 2022-04-18 DIAGNOSIS — Z01818 Encounter for other preprocedural examination: Secondary | ICD-10-CM

## 2022-04-18 DIAGNOSIS — I10 Essential (primary) hypertension: Secondary | ICD-10-CM

## 2022-04-18 HISTORY — DX: Essential (primary) hypertension: I10

## 2022-04-18 SURGERY — INSERTION, RADIATION SOURCE, PROSTATE
Anesthesia: Choice

## 2022-04-29 ENCOUNTER — Other Ambulatory Visit: Payer: Self-pay | Admitting: Family Medicine

## 2022-04-29 DIAGNOSIS — I10 Essential (primary) hypertension: Secondary | ICD-10-CM

## 2022-05-01 ENCOUNTER — Telehealth: Payer: Self-pay

## 2022-05-01 NOTE — Telephone Encounter (Signed)
Received sleep referral from Rachell Cipro MD for OSA. Placed in sleep lab mailbox

## 2022-05-05 ENCOUNTER — Other Ambulatory Visit: Payer: Self-pay | Admitting: Urology

## 2022-05-19 ENCOUNTER — Other Ambulatory Visit: Payer: Self-pay

## 2022-05-19 ENCOUNTER — Encounter (HOSPITAL_BASED_OUTPATIENT_CLINIC_OR_DEPARTMENT_OTHER): Payer: Self-pay | Admitting: Urology

## 2022-05-19 NOTE — Progress Notes (Signed)
Spoke w/ via phone for pre-op interview---Gary Rose needs dos----  ISTAT             Rose results------03/14/22 EKG in [place] test -----patient states asymptomatic no test needed Arrive at -------0600 on 05/23/2022 NPO after MN NO Solid Food.  Clear liquids from MN until---0500 Med rec completed Medications to take morning of surgery -----Uroxatral, Xanax prn, Amlodipine, Wellbutrin, Singulair, Effexor, Atrovent nasal spray Diabetic medication -----n/a Patient instructed no nail polish to be worn day of surgery Patient instructed to bring photo id and insurance card day of surgery Patient aware to have Driver (ride ) / caregiver    for 24 hours after surgery - wife, Gary Rose Patient Special Instructions -----Do Fleet's enema night night before surgery or as directed by surgeon.  Pre-Op special Istructions -----none Patient verbalized understanding of instructions that were given at this phone interview. Patient denies shortness of breath, chest pain, fever, cough at this phone interview.

## 2022-05-21 NOTE — Progress Notes (Signed)
Patient called in and wanted to know his discharge time. I gave him an approximate time if everything is going on time.

## 2022-05-23 ENCOUNTER — Ambulatory Visit (HOSPITAL_BASED_OUTPATIENT_CLINIC_OR_DEPARTMENT_OTHER): Payer: BC Managed Care – PPO | Admitting: Anesthesiology

## 2022-05-23 ENCOUNTER — Encounter (HOSPITAL_BASED_OUTPATIENT_CLINIC_OR_DEPARTMENT_OTHER)
Admission: RE | Disposition: A | Discharge status: Home / Self Care | Payer: Self-pay | Source: Home / Self Care | Attending: Urology

## 2022-05-23 ENCOUNTER — Encounter (HOSPITAL_BASED_OUTPATIENT_CLINIC_OR_DEPARTMENT_OTHER): Payer: Self-pay | Admitting: Urology

## 2022-05-23 ENCOUNTER — Ambulatory Visit (HOSPITAL_BASED_OUTPATIENT_CLINIC_OR_DEPARTMENT_OTHER)
Admission: RE | Admit: 2022-05-23 | Discharge: 2022-05-23 | Disposition: A | Discharge status: Home / Self Care | Payer: BC Managed Care – PPO | Attending: Urology | Admitting: Urology

## 2022-05-23 ENCOUNTER — Other Ambulatory Visit: Payer: Self-pay

## 2022-05-23 DIAGNOSIS — Z01818 Encounter for other preprocedural examination: Secondary | ICD-10-CM

## 2022-05-23 DIAGNOSIS — Z79899 Other long term (current) drug therapy: Secondary | ICD-10-CM | POA: Diagnosis not present

## 2022-05-23 DIAGNOSIS — F419 Anxiety disorder, unspecified: Secondary | ICD-10-CM | POA: Diagnosis not present

## 2022-05-23 DIAGNOSIS — I10 Essential (primary) hypertension: Secondary | ICD-10-CM | POA: Diagnosis not present

## 2022-05-23 DIAGNOSIS — F32A Depression, unspecified: Secondary | ICD-10-CM | POA: Diagnosis not present

## 2022-05-23 DIAGNOSIS — C61 Malignant neoplasm of prostate: Secondary | ICD-10-CM | POA: Diagnosis present

## 2022-05-23 HISTORY — DX: Personal history of urinary calculi: Z87.442

## 2022-05-23 HISTORY — DX: Malignant (primary) neoplasm, unspecified: C80.1

## 2022-05-23 HISTORY — DX: Sleep apnea, unspecified: G47.30

## 2022-05-23 HISTORY — DX: Presence of spectacles and contact lenses: Z97.3

## 2022-05-23 HISTORY — PX: GOLD SEED IMPLANT: SHX6343

## 2022-05-23 HISTORY — DX: Dermatitis, unspecified: L30.9

## 2022-05-23 LAB — POCT I-STAT, CHEM 8
BUN: 18 mg/dL (ref 8–23)
Calcium, Ion: 1.23 mmol/L (ref 1.15–1.40)
Chloride: 99 mmol/L (ref 98–111)
Creatinine, Ser: 1.1 mg/dL (ref 0.61–1.24)
Glucose, Bld: 113 mg/dL — ABNORMAL HIGH (ref 70–99)
HCT: 44 % (ref 39.0–52.0)
Hemoglobin: 15 g/dL (ref 13.0–17.0)
Potassium: 3.7 mmol/L (ref 3.5–5.1)
Sodium: 143 mmol/L (ref 135–145)
TCO2: 30 mmol/L (ref 22–32)

## 2022-05-23 SURGERY — INSERTION, GOLD SEEDS
Anesthesia: Monitor Anesthesia Care | Site: Prostate

## 2022-05-23 MED ORDER — AMISULPRIDE (ANTIEMETIC) 5 MG/2ML IV SOLN: 10.0000 mg | Freq: Once | INTRAVENOUS | Status: DC | PRN

## 2022-05-23 MED ORDER — CEFAZOLIN SODIUM-DEXTROSE 2-4 GM/100ML-% IV SOLN
INTRAVENOUS | Status: AC
  Filled 2022-05-23: qty 100

## 2022-05-23 MED ORDER — MIDAZOLAM HCL 2 MG/2ML IJ SOLN
INTRAMUSCULAR | Status: AC
  Filled 2022-05-23: qty 2

## 2022-05-23 MED ORDER — OXYCODONE HCL 5 MG PO TABS: 5.0000 mg | ORAL_TABLET | Freq: Once | ORAL | Status: DC | PRN

## 2022-05-23 MED ORDER — ACETAMINOPHEN 500 MG PO TABS
ORAL_TABLET | ORAL | Status: AC
  Filled 2022-05-23: qty 2

## 2022-05-23 MED ORDER — LIDOCAINE 2% (20 MG/ML) 5 ML SYRINGE
INTRAMUSCULAR | Status: DC | PRN
  Administered 2022-05-23: 60 mg via INTRAVENOUS

## 2022-05-23 MED ORDER — ONDANSETRON HCL 4 MG/2ML IJ SOLN
INTRAMUSCULAR | Status: DC | PRN
  Administered 2022-05-23: 4 mg via INTRAVENOUS

## 2022-05-23 MED ORDER — PROPOFOL 500 MG/50ML IV EMUL
INTRAVENOUS | Status: DC | PRN
  Administered 2022-05-23: 125 ug/kg/min via INTRAVENOUS

## 2022-05-23 MED ORDER — CEFAZOLIN SODIUM-DEXTROSE 2-3 GM-%(50ML) IV SOLR
INTRAVENOUS | Status: DC | PRN
  Administered 2022-05-23: 2 g via INTRAVENOUS

## 2022-05-23 MED ORDER — LIDOCAINE HCL (PF) 2 % IJ SOLN
INTRAMUSCULAR | Status: AC
  Filled 2022-05-23: qty 5

## 2022-05-23 MED ORDER — FENTANYL CITRATE (PF) 100 MCG/2ML IJ SOLN
INTRAMUSCULAR | Status: AC
  Filled 2022-05-23: qty 2

## 2022-05-23 MED ORDER — EPHEDRINE SULFATE-NACL 50-0.9 MG/10ML-% IV SOSY
PREFILLED_SYRINGE | INTRAVENOUS | Status: DC | PRN
  Administered 2022-05-23 (×2): 5 mg via INTRAVENOUS

## 2022-05-23 MED ORDER — DEXAMETHASONE SODIUM PHOSPHATE 10 MG/ML IJ SOLN
INTRAMUSCULAR | Status: AC
  Filled 2022-05-23: qty 1

## 2022-05-23 MED ORDER — PROPOFOL 10 MG/ML IV BOLUS
INTRAVENOUS | Status: DC | PRN
  Administered 2022-05-23: 20 mg via INTRAVENOUS

## 2022-05-23 MED ORDER — MIDAZOLAM HCL 5 MG/5ML IJ SOLN
INTRAMUSCULAR | Status: DC | PRN
  Administered 2022-05-23: 2 mg via INTRAVENOUS

## 2022-05-23 MED ORDER — OXYCODONE HCL 5 MG/5ML PO SOLN: 5.0000 mg | Freq: Once | ORAL | Status: DC | PRN

## 2022-05-23 MED ORDER — KETOROLAC TROMETHAMINE 30 MG/ML IJ SOLN: 30.0000 mg | Freq: Once | INTRAMUSCULAR | Status: DC | PRN

## 2022-05-23 MED ORDER — ONDANSETRON HCL 4 MG/2ML IJ SOLN
INTRAMUSCULAR | Status: AC
  Filled 2022-05-23: qty 2

## 2022-05-23 MED ORDER — ONDANSETRON HCL 4 MG/2ML IJ SOLN: 4.0000 mg | Freq: Once | INTRAMUSCULAR | Status: DC | PRN

## 2022-05-23 MED ORDER — BUPIVACAINE HCL (PF) 0.25 % IJ SOLN
INTRAMUSCULAR | Status: DC | PRN
  Administered 2022-05-23: 10 mL

## 2022-05-23 MED ORDER — FENTANYL CITRATE (PF) 100 MCG/2ML IJ SOLN
INTRAMUSCULAR | Status: DC | PRN
  Administered 2022-05-23: 50 ug via INTRAVENOUS

## 2022-05-23 MED ORDER — EPHEDRINE 5 MG/ML INJ
INTRAVENOUS | Status: AC
  Filled 2022-05-23: qty 5

## 2022-05-23 MED ORDER — ACETAMINOPHEN 500 MG PO TABS
1000.0000 mg | ORAL_TABLET | Freq: Once | ORAL | Status: AC
  Administered 2022-05-23: 1000 mg via ORAL

## 2022-05-23 MED ORDER — FLEET ENEMA 7-19 GM/118ML RE ENEM: 1.0000 | ENEMA | Freq: Once | RECTAL | Status: DC

## 2022-05-23 MED ORDER — PROPOFOL 500 MG/50ML IV EMUL
INTRAVENOUS | Status: AC
  Filled 2022-05-23: qty 50

## 2022-05-23 MED ORDER — FENTANYL CITRATE (PF) 100 MCG/2ML IJ SOLN: 25.0000 ug | INTRAMUSCULAR | Status: DC | PRN

## 2022-05-23 MED ORDER — LACTATED RINGERS IV SOLN: INTRAVENOUS | Status: DC

## 2022-05-23 SURGICAL SUPPLY — 23 items
BLADE CLIPPER SENSICLIP SURGIC (BLADE) ×1 IMPLANT
CNTNR URN SCR LID CUP LEK RST (MISCELLANEOUS) ×1 IMPLANT
CONT SPEC 4OZ STRL OR WHT (MISCELLANEOUS) ×1
COVER BACK TABLE 60X90IN (DRAPES) ×1 IMPLANT
DRSG TEGADERM 4X4.75 (GAUZE/BANDAGES/DRESSINGS) ×1 IMPLANT
DRSG TEGADERM 8X12 (GAUZE/BANDAGES/DRESSINGS) ×1 IMPLANT
GAUZE SPONGE 4X4 12PLY STRL (GAUZE/BANDAGES/DRESSINGS) ×1 IMPLANT
GLOVE BIO SURGEON STRL SZ7.5 (GLOVE) ×1 IMPLANT
GLOVE ECLIPSE 8.0 STRL XLNG CF (GLOVE) ×1 IMPLANT
KIT TURNOVER CYSTO (KITS) ×1 IMPLANT
MARKER GOLD PRELOAD 1.2X3 (Urological Implant) ×1 IMPLANT
MARKER SKIN DUAL TIP RULER LAB (MISCELLANEOUS) ×1 IMPLANT
NDL SPNL 22GX3.5 QUINCKE BK (NEEDLE) ×1 IMPLANT
NEEDLE SPNL 22GX3.5 QUINCKE BK (NEEDLE) ×1 IMPLANT
SEED GOLD PRELOAD 1.2X3 (Urological Implant) ×1 IMPLANT
SHEATH ULTRASOUND LF (SHEATH) IMPLANT
SHEATH ULTRASOUND LTX NONSTRL (SHEATH) IMPLANT
SLEEVE SCD COMPRESS KNEE MED (STOCKING) ×1 IMPLANT
SURGILUBE 2OZ TUBE FLIPTOP (MISCELLANEOUS) ×1 IMPLANT
SYR 10ML LL (SYRINGE) IMPLANT
SYR CONTROL 10ML LL (SYRINGE) ×1 IMPLANT
TOWEL OR 17X24 6PK STRL BLUE (TOWEL DISPOSABLE) ×1 IMPLANT
UNDERPAD 30X36 HEAVY ABSORB (UNDERPADS AND DIAPERS) ×1 IMPLANT

## 2022-05-23 NOTE — Transfer of Care (Signed)
Immediate Anesthesia Transfer of Care Note  Patient: Gary Rose  Procedure(s) Performed: GOLD SEED IMPLANT (Prostate)  Patient Location: PACU  Anesthesia Type:MAC  Level of Consciousness: awake, alert , and oriented  Airway & Oxygen Therapy: Patient Spontanous Breathing and Patient connected to face mask oxygen  Post-op Assessment: Report given to RN and Post -op Vital signs reviewed and stable  Post vital signs: Reviewed and stable  Last Vitals:  Vitals Value Taken Time  BP 103/68 05/23/22 0850  Temp    Pulse 43 05/23/22 0852  Resp 9 05/23/22 0852  SpO2 100 % 05/23/22 0852  Vitals shown include unvalidated device data.  Last Pain:  Vitals:   05/23/22 0640  TempSrc: Oral  PainSc: 0-No pain      Patients Stated Pain Goal: 5 ([id number])  Complications: No notable events documented.

## 2022-05-23 NOTE — Anesthesia Preprocedure Evaluation (Addendum)
Anesthesia Evaluation  Patient identified by MRN, date of birth, ID band Patient awake    Reviewed: Allergy & Precautions, H&P , NPO status , Patient's Chart, lab work & pertinent test results, reviewed documented beta blocker date and time   Airway Mallampati: III  TM Distance: >3 FB Neck ROM: Full    Dental  (+) Teeth Intact, Dental Advisory Given   Pulmonary asthma (remote hx) , sleep apnea (in the process of scheduling sleep study)  Snores loudly per wife   Pulmonary exam normal breath sounds clear to auscultation       Cardiovascular hypertension, Pt. on medications and Pt. on home beta blockers Normal cardiovascular exam Rhythm:Regular Rate:Normal     Neuro/Psych  PSYCHIATRIC DISORDERS Anxiety Depression    negative neurological ROS     GI/Hepatic Neg liver ROS,GERD  Controlled,,  Endo/Other  negative endocrine ROS    Renal/GU negative Renal ROS  negative genitourinary   Musculoskeletal negative musculoskeletal ROS (+)    Abdominal   Peds negative pediatric ROS (+)  Hematology negative hematology ROS (+)   Anesthesia Other Findings   Reproductive/Obstetrics negative OB ROS                             Anesthesia Physical Anesthesia Plan  ASA: 2  Anesthesia Plan: MAC   Post-op Pain Management: Tylenol PO (pre-op)*   Induction:   PONV Risk Score and Plan: 2 and Propofol infusion, TIVA and Midazolam  Airway Management Planned: Natural Airway and Simple Face Mask  Additional Equipment: None  Intra-op Plan:   Post-operative Plan:   Informed Consent: I have reviewed the patients History and Physical, chart, labs and discussed the procedure including the risks, benefits and alternatives for the proposed anesthesia with the patient or authorized representative who has indicated his/her understanding and acceptance.       Plan Discussed with: CRNA  Anesthesia Plan  Comments:         Anesthesia Quick Evaluation

## 2022-05-23 NOTE — H&P (Signed)
Office Visit Report     05/16/2022   REFERRING:  Rachell Cipro, MD  PROVIDER:  Franchot Gallo, M.D.  TREATING:  Jiles Crocker, NP  LOCATION:  [facility] [phone]     --------------------------------------------------------------------------------   CC/HPI: Mr. Gary Rose is here today for PCa conference.   7.7.2023: TRUS/Bx. PSA 4.2, prostate volume 37.6 mL, PSAD 0.11.  5/12 cores revealed adenocarcinoma:  1 core (Lt apex medial) w/ GS 3+3 in 5% of core  4 cores (Rt apex medial, Rt apex lateral, Rt mid medial, Rt mid lateral) w/ GS 3+$ pattern in 60, 20, 10 and 5% of cores respectively. PNI present in Rt apex medial core.   IPSS 11, quality-of-life score 3. He is on alpha blockers.  Shim score 11. He does use sildenafil at times.   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   05/16/2022: Patient here today for preoperative appointment, he is elected to proceed with XRT for 5-1/2 weeks for definitive management of his underlying prostate cancer diagnosis. His insurance company denied [place]. Patient denies any changes in past medical history, prescription medications taken on daily basis, no interval surgical or procedural interventions. Currently on alfuzosin for management of underlying LUTS. He has had no appreciable change in baseline voiding symptoms, denies any interval dysuria, gross hematuria or interval treatment for UTI. He denies any recent chest pain, shortness of breath, nausea/vomiting, fever/chills.     ALLERGIES: No Allergies    MEDICATIONS: Cialis 20 mg tablet 1 tablet PO prn  Uroxatral 10 mg tablet, extended release 24 hr 1 tablet PO Daily  Alfuzosin Hcl Er  Alprazolam 0.25 mg tablet  Amlodipine Besylate 10 mg tablet  Dupixent Pen 200 mg/1.14 ml pen injector  Fish Oil  Ipratropium-Albuterol  Losartan-Hydrochlorothiazide 100 mg-12.5 mg tablet  Montelukast Sodium 10 mg tablet   Multi-Vitamin Oral Tablet Oral  Venlafaxine Hcl  Vitamin D3  Wellbutrin XL 300 MG Oral Tablet Extended Release 24 Hour Oral     GU PSH: Prostate Needle Biopsy - 10/04/2021       PSH Notes: Cholecystectomy   NON-GU PSH: Cholecystectomy (open) - 2012 Surgical Pathology, Gross And Microscopic Examination For Prostate Needle - 10/04/2021     GU PMH: Prostate Cancer, Grade group 2 adenocarcinoma the prostate. 5 cores positive, 1 with perineural invasion. Mild to moderate voiding symptoms, uses alpha blockers. He does have moderate erectile dysfunction, on sildenafil. Nomogram predictions- Organ confined disease-66% Seminal vesicle/lymph node involvement-3/4% 5/10-year cancer free survival with radical prostatectomy-86/76% - 10/30/2021 Elevated PSA - 10/04/2021, PSA has been climbing since 2009 although I do not have any numbers between 2009 in late 2022. He has a benign gland, - 05/08/2021 BPH w/LUTS, He is on tamsulosin. This is causing retrograde ejaculation - 05/08/2021 ED due to arterial insufficiency, On sildenafil 100 mg with less than perfect results - 05/08/2021, Erectile dysfunction due to arterial insufficiency, - 2014 Other ejaculatory dysfunction, He does have retrograde ejaculation but delayed ejaculation as well. He is on tamsulosin and an SSRI - 05/08/2021 Weak Urinary Stream - 05/08/2021 Bladder-neck stenosis/contracture, Bladder outlet obstruction - 2014 Testicular pain, unspecified, Testicular pain - 2014      PMH Notes:  1898-03-31 00:00:00 - Note: Normal Routine History And Physical Adult  2011-03-03 09:00:54 - Note: Attention Deficit Disorder Without Hyperactivity   NON-GU PMH: Anxiety, Anxiety (Symptom) - 2014 Arrhythmia, Rhythm Disorder - 2014 Asthma, Asthma - 2014 Personal history of other diseases of the digestive system, History of  esophageal reflux - 2014 Personal history of other endocrine, nutritional and metabolic disease, History of hypercholesterolemia - 2014 Personal  history of other mental and behavioral disorders, History of depression - 2014    FAMILY HISTORY: Alzheimer's Disease - Father Brain tumor - Mother Death In The Family Father - Runs In Family Death In The Family Mother - Runs In Family Family Health Status Number - Runs In Family   SOCIAL HISTORY: Marital Status: Married Current Smoking Status: Patient has never smoked.   Tobacco Use Assessment Completed: Used Tobacco in last 30 days? Has never drank.  Drinks 1 caffeinated drink per day.     Notes: Caffeine Use, Alcohol Use, Never A Smoker, Marital History - Currently Married, Occupation:   REVIEW OF SYSTEMS:    GU Review Male:   Patient reports frequent urination and get up at night to urinate. Patient denies hard to postpone urination, burning/ pain with urination, leakage of urine, stream starts and stops, trouble starting your stream, have to strain to urinate , erection problems, and penile pain.  Gastrointestinal (Upper):   Patient denies nausea, vomiting, and indigestion/ heartburn.  Gastrointestinal (Lower):   Patient denies diarrhea and constipation.  Constitutional:   Patient denies fever, night sweats, weight loss, and fatigue.  Skin:   Patient denies skin rash/ lesion and itching.  Eyes:   Patient denies blurred vision and double vision.  Ears/ Nose/ Throat:   Patient denies sore throat and sinus problems.  Hematologic/Lymphatic:   Patient denies swollen glands and easy bruising.  Cardiovascular:   Patient denies chest pains and leg swelling.  Respiratory:   Patient denies cough and shortness of breath.  Endocrine:   Patient denies excessive thirst.  Musculoskeletal:   Patient denies back pain and joint pain.  Neurological:   Patient denies headaches and dizziness.  Psychologic:   Patient denies depression and anxiety.   VITAL SIGNS:      05/16/2022 08:11 AM  Weight 185 lb / 83.91 kg  Height 66 in / 167.64 cm  BP 119/72 mmHg  Heart Rate 53 /min  Temperature 97.3 F  / 36.2 C  BMI 29.9 kg/m   MULTI-SYSTEM PHYSICAL EXAMINATION:    Constitutional: Well-nourished. No physical deformities. Normally developed. Good grooming.  Neck: Neck symmetrical, not swollen. Normal tracheal position.  Respiratory: No labored breathing, no use of accessory muscles.   Cardiovascular: Normal temperature, normal extremity pulses, no swelling, no varicosities.  Skin: No paleness, no jaundice, no cyanosis. No lesion, no ulcer, no rash.  Neurologic / Psychiatric: Oriented to time, oriented to place, oriented to person. No depression, no anxiety, no agitation.  Gastrointestinal: Obese abdomen. No hernia. No mass, no tenderness, no rigidity.   Musculoskeletal: Normal gait and station of head and neck.     Complexity of Data:  Source Of History:  Patient, Medical Record Summary  Lab Test Review:   PSA  Records Review:   Pathology Reports, Previous Doctor Records, Previous Hospital Records, Previous Patient Records  Urine Test Review:   Urinalysis   05/08/21 03/21/21 10/31/19 02/21/03 11/22/02  PSA  Total PSA 4.12 ng/mL 4.540 ng/ml 3.290 ng/ml 1.38  1.21   Free PSA 0.63 ng/mL   0.41    % Free PSA 15 % PSA   29.7      02/21/03 11/22/02  Hormones  Testosterone, Total 3.30  9.01     05/16/22  Urinalysis  Urine Appearance Clear   Urine Color Yellow   Urine Glucose Neg mg/dL  Urine Bilirubin  Neg mg/dL  Urine Ketones Neg mg/dL  Urine Specific Gravity 1.025   Urine Blood Neg ery/uL  Urine pH 6.5   Urine Protein Neg mg/dL  Urine Urobilinogen 0.2 mg/dL  Urine Nitrites Neg   Urine Leukocyte Esterase Neg leu/uL   PROCEDURES:          Urinalysis Dipstick Dipstick Cont'd  Color: Yellow Bilirubin: Neg mg/dL  Appearance: Clear Ketones: Neg mg/dL  Specific Gravity: 1.025 Blood: Neg ery/uL  pH: 6.5 Protein: Neg mg/dL  Glucose: Neg mg/dL Urobilinogen: 0.2 mg/dL    Nitrites: Neg    Leukocyte Esterase: Neg leu/uL    ASSESSMENT:      ICD-10 Details  1 GU:   Prostate  Cancer - C61 Chronic, Threat to Bodily Function  2 NON-GU:   Encounter for other preprocedural examination - Z01.818 Undiagnosed New Problem   PLAN:            Medications Stop Meds: Tamsulosin Hcl 0.4 mg capsule  Discontinue: 05/16/2022  - Reason: The medication cycle was completed.  Norvasc TABS Oral  Start: 03/03/2011  Discontinue: 05/16/2022  - Reason: The medication cycle was completed.  Pravastatin Sodium 40 MG Oral Tablet Oral  Start: 03/03/2011  Discontinue: 05/16/2022  - Reason: The medication cycle was completed.  Viagra 100 mg tablet 0 Oral  Start: 03/03/2011  Discontinue: 05/16/2022  - Reason: The medication cycle was completed.            Orders Labs Urine Culture          Schedule Return Visit/Planned Activity: Keep Scheduled Appointment - Follow up MD, Schedule Surgery          Document Letter(s):  Created for Patient: Clinical Summary         Notes:   All questions answered to the best my ability regarding the upcoming procedure and expected postoperative course with understanding expressed by the patient. Urine culture sent today to serve as a baseline. He will proceed with previously scheduled fiducial marker placement on 2/23.        Next Appointment:      Next Appointment: 05/23/2022 08:30 AM    Appointment Type: Surgery     Location: [facility] [phone]    Provider: Rexene Alberts, M.D.    Reason for Visit: NE/OP FIDUCIAL MARKERS only - no space oar per Dr. Diona Fanti- Insurance denied    Urology Preoperative H&P   Chief Complaint: Prostate cancer  History of Present Illness: TAHJE SAVALA is a 62 y.o. male with prostate cancer here for fiducial marker placement.    Past Medical History:  Diagnosis Date   Anxiety    takes xanax prn   Arrhythmia 2014   Asthma    hx of asthma in college   Atypical chest pain 2018   Dr. Pearlean Brownie, cardiologist. Pt had a stress echo treadmill test. Per pt, he thinks the results were  normal. No problems since.   Cancer [facility])    prostate cancer, follows w/ [facility]   Depression    Eczema    Follows w/ Dr. Fontaine No @ [facility].   Elevated PSA    Erectile dysfunction 2023   GERD (gastroesophageal reflux disease)    History of kidney stones    not sure of date   Hypercholesterolemia 2014   Hypertension    Follows w/ PCP, Dr. Rachell Cipro.   Sleep apnea    not diagnosed per patient, he states that he is in the process  of scheduling a sleep study with [facility], as of 05/19/22   Stenosis of bladder neck 2014   Testicular pain 2014   Weak urinary stream 2023   Wears glasses     Past Surgical History:  Procedure Laterality Date   CARDIOVASCULAR STRESS TEST  04/15/2016   echocardiogram stress treadmill, per patient , he thinks the results were normal   CHOLECYSTECTOMY  2005   COLONOSCOPY  10/2021   1 or 2 polyps   PROSTATE BIOPSY N/A 10/04/2021   WISDOM TOOTH EXTRACTION      Allergies: No Known Allergies  Family History  Problem Relation Age of Onset   Brain cancer Mother    Alzheimer's disease Father     Social History:  reports that he has never smoked. He has never used smokeless tobacco. He reports that he does not currently use alcohol. He reports that he does not use drugs.  ROS: A complete review of systems was performed.  All systems are negative except for pertinent findings as noted.  Physical Exam:  Vital signs in last 24 hours: Temp:  [97.8 F (36.6 C)] 97.8 F (36.6 C) (02/23 0640) Pulse Rate:  [51] 51 (02/23 0640) Resp:  [16] 16 (02/23 0640) BP: (119)/(71) 119/71 (02/23 0640) SpO2:  [98 %] 98 % (02/23 0640) Weight:  [86.4 kg] 86.4 kg (02/23 0640) Constitutional:  Alert and oriented, No acute distress Cardiovascular: Regular rate and rhythm Respiratory: Normal respiratory effort, Lungs clear bilaterally GI: Abdomen is soft, nontender, nondistended, no abdominal masses GU: No CVA  tenderness Lymphatic: No lymphadenopathy Neurologic: Grossly intact, no focal deficits Psychiatric: Normal mood and affect  Laboratory Data:  Recent Labs    05/23/22 0657  HGB 15.0  HCT 44.0    Recent Labs    05/23/22 0657  NA 143  K 3.7  CL 99  GLUCOSE 113*  BUN 18  CREATININE 1.10     Results for orders placed or performed during the hospital encounter of 05/23/22 (from the past 24 hour(s))  I-STAT, chem 8     Status: Abnormal   Collection Time: 05/23/22  6:57 AM  Result Value Ref Range   Sodium 143 135 - 145 mmol/L   Potassium 3.7 3.5 - 5.1 mmol/L   Chloride 99 98 - 111 mmol/L   BUN 18 8 - 23 mg/dL   Creatinine, Ser 1.10 0.61 - 1.24 mg/dL   Glucose, Bld 113 (H) 70 - 99 mg/dL   Calcium, Ion 1.23 1.15 - 1.40 mmol/L   TCO2 30 22 - 32 mmol/L   Hemoglobin 15.0 13.0 - 17.0 g/dL   HCT 44.0 39.0 - 52.0 %   No results found for this or any previous visit (from the past 240 hour(s)).  Renal Function: Recent Labs    05/23/22 0657  CREATININE 1.10   Estimated Creatinine Clearance: 70.8 mL/min (by C-G formula based on SCr of 1.1 mg/dL).  Radiologic Imaging: No results found.  I independently reviewed the above imaging studies.  Assessment and Plan SHAYLAN VOSE is a 62 y.o. male with prostate cancer here for fiducial marker placement.  Matt R. Rylie Limburg MD 05/23/2022, 8:06 AM  [facility] Pager: [phone]): [phone]

## 2022-05-23 NOTE — Anesthesia Postprocedure Evaluation (Signed)
Anesthesia Post Note  Patient: COLONEL BITZER  Procedure(s) Performed: GOLD SEED IMPLANT (Prostate)     Patient location during evaluation: PACU Anesthesia Type: MAC Level of consciousness: awake and alert Pain management: pain level controlled Vital Signs Assessment: post-procedure vital signs reviewed and stable Respiratory status: spontaneous breathing, nonlabored ventilation and respiratory function stable Cardiovascular status: blood pressure returned to baseline and stable Postop Assessment: no apparent nausea or vomiting Anesthetic complications: no   No notable events documented.  Last Vitals:  Vitals:   05/23/22 0640 05/23/22 0851  BP: 119/71 103/68  Pulse: (!) 51 (!) 44  Resp: 16 13  Temp: 36.6 C 36.6 C  SpO2: 98% 100%    Last Pain:  Vitals:   05/23/22 0851  TempSrc:   PainSc: 0-No pain                 Pervis Hocking

## 2022-05-23 NOTE — Op Note (Signed)
Operative Note  Preoperative diagnosis:  1.  Clinically localized adenocarcinoma of the prostate  Postoperative diagnosis: 1.  Clinically localized adenocarcinoma of the prostate  Procedure(s): 1. Placement of fiducial markers into prostate  Surgeon: Rexene Alberts, MD  Assistants:  None  Anesthesia:  General  Complications:  None  EBL:  Minimal  Specimens: 1. None  Drains/Catheters: 1.  None  Indication:  Gary Rose is a 62 y.o. male with clinically localized prostate cancer. After discussing management options for treatment, he elected to proceed with radiotherapy. He presents today for the above procedures. The potential risks, complications, alternative options, and expected recovery course have been discussed in detail with the patient and he has provided informed consent to proceed.  Description of procedure: The patient was administered preoperative antibiotics, placed in the dorsal lithotomy position, and prepped and draped in the usual sterile fashion. Next, transrectal ultrasonography was utilized to visualize the prostate. Three gold fiducial markers were then placed into the prostate via transperineal needles under ultrasound guidance at the right apex, right base, and left mid gland under direct ultrasound guidance. He tolerated the procedure well and without complications. He was given a voiding trial prior to discharge from the PACU.  Matt R. [facility]  Pager: [phone]

## 2022-05-23 NOTE — Discharge Instructions (Signed)
   No acetaminophen/Tylenol until after 1:00 pm today if needed.  Post Anesthesia Home Care Instructions  Activity: Get plenty of rest for the remainder of the day. A responsible individual must stay with you for 24 hours following the procedure.  For the next 24 hours, DO NOT: -Drive a car -Paediatric nurse -Drink alcoholic beverages -Take any medication unless instructed by your physician -Make any legal decisions or sign important papers.  Meals: Start with liquid foods such as gelatin or soup. Progress to regular foods as tolerated. Avoid greasy, spicy, heavy foods. If nausea and/or vomiting occur, drink only clear liquids until the nausea and/or vomiting subsides. Call your physician if vomiting continues.  Special Instructions/Symptoms: Your throat may feel dry or sore from the anesthesia or the breathing tube placed in your throat during surgery. If this causes discomfort, gargle with warm salt water. The discomfort should disappear within 24 hours.

## 2022-05-26 ENCOUNTER — Encounter (HOSPITAL_BASED_OUTPATIENT_CLINIC_OR_DEPARTMENT_OTHER): Payer: Self-pay | Admitting: Urology

## 2022-06-02 ENCOUNTER — Inpatient Hospital Stay: Admission: RE | Admit: 2022-06-02 | Payer: BC Managed Care – PPO | Source: Ambulatory Visit

## 2022-06-03 ENCOUNTER — Telehealth: Payer: Self-pay | Admitting: *Deleted

## 2022-06-03 NOTE — Telephone Encounter (Signed)
Called patient to remind of sim appt. for 06-05-22- arrival time- 1:45 pm, informed patient to arrive with a full bladder, lvm for a return call

## 2022-06-04 NOTE — Progress Notes (Signed)
  Radiation Oncology         [phone] ________________________________  Name: Gary Rose MRN: [id number]  Date: 06/05/2022  DOB: Jun 06, 1960  SIMULATION AND TREATMENT PLANNING NOTE    ICD-10-CM   1. Malignant neoplasm of prostate ([place])  C61       DIAGNOSIS:  63 y.o. gentleman with Stage T1c adenocarcinoma of the prostate with Gleason score of 3+4, and PSA of 4.12.    NARRATIVE:  The patient was brought to the [place].  Identity was confirmed.  All relevant records and images related to the planned course of therapy were reviewed.  The patient freely provided informed written consent to proceed with treatment after reviewing the details related to the planned course of therapy. The consent form was witnessed and verified by the simulation staff.  Then, the patient was set-up in a stable reproducible supine position for radiation therapy.  A vacuum lock pillow device was custom fabricated to position his legs in a reproducible immobilized position.  Then, I performed a urethrogram under sterile conditions to identify the prostatic apex.  CT images were obtained.  Surface markings were placed.  The CT images were loaded into the planning software.  Then the prostate target and avoidance structures including the rectum, bladder, bowel and hips were contoured.  Treatment planning then occurred.  The radiation prescription was entered and confirmed.  A total of one complex treatment devices was fabricated. I have requested : Intensity Modulated Radiotherapy (IMRT) is medically necessary for this case for the following reason:  Rectal sparing.Marland Kitchen  PLAN:  The patient will receive 70 Gy in 28 fractions.  ________________________________  Sheral Apley Tammi Klippel, M.D.

## 2022-06-05 ENCOUNTER — Other Ambulatory Visit: Payer: Self-pay

## 2022-06-05 ENCOUNTER — Ambulatory Visit
Admission: RE | Admit: 2022-06-05 | Discharge: 2022-06-05 | Disposition: A | Discharge status: Home / Self Care | Payer: BC Managed Care – PPO | Source: Ambulatory Visit | Attending: Radiation Oncology | Admitting: Radiation Oncology

## 2022-06-05 DIAGNOSIS — C61 Malignant neoplasm of prostate: Secondary | ICD-10-CM | POA: Insufficient documentation

## 2022-06-10 DIAGNOSIS — C61 Malignant neoplasm of prostate: Secondary | ICD-10-CM | POA: Diagnosis not present

## 2022-06-23 ENCOUNTER — Other Ambulatory Visit: Payer: Self-pay

## 2022-06-23 ENCOUNTER — Ambulatory Visit
Admission: RE | Admit: 2022-06-23 | Discharge: 2022-06-23 | Disposition: A | Discharge status: Home / Self Care | Payer: BC Managed Care – PPO | Source: Ambulatory Visit | Attending: Radiation Oncology | Admitting: Radiation Oncology

## 2022-06-23 DIAGNOSIS — C61 Malignant neoplasm of prostate: Secondary | ICD-10-CM | POA: Diagnosis not present

## 2022-06-23 LAB — RAD ONC ARIA SESSION SUMMARY
Course Elapsed Days: 0
Plan Fractions Treated to Date: 1
Plan Prescribed Dose Per Fraction: 2.5 Gy
Plan Total Fractions Prescribed: 28
Plan Total Prescribed Dose: 70 Gy
Reference Point Dosage Given to Date: 2.5 Gy
Reference Point Session Dosage Given: 2.5 Gy
Session Number: 1

## 2022-06-24 ENCOUNTER — Other Ambulatory Visit: Payer: Self-pay

## 2022-06-24 ENCOUNTER — Ambulatory Visit
Admission: RE | Admit: 2022-06-24 | Discharge: 2022-06-24 | Disposition: A | Discharge status: Home / Self Care | Payer: BC Managed Care – PPO | Source: Ambulatory Visit | Attending: Radiation Oncology | Admitting: Radiation Oncology

## 2022-06-24 DIAGNOSIS — C61 Malignant neoplasm of prostate: Secondary | ICD-10-CM | POA: Diagnosis not present

## 2022-06-24 LAB — RAD ONC ARIA SESSION SUMMARY
Course Elapsed Days: 1
Plan Fractions Treated to Date: 2
Plan Prescribed Dose Per Fraction: 2.5 Gy
Plan Total Fractions Prescribed: 28
Plan Total Prescribed Dose: 70 Gy
Reference Point Dosage Given to Date: 5 Gy
Reference Point Session Dosage Given: 2.5 Gy
Session Number: 2

## 2022-06-25 ENCOUNTER — Ambulatory Visit
Admission: RE | Admit: 2022-06-25 | Discharge: 2022-06-25 | Disposition: A | Discharge status: Home / Self Care | Payer: BC Managed Care – PPO | Source: Ambulatory Visit | Attending: Radiation Oncology | Admitting: Radiation Oncology

## 2022-06-25 ENCOUNTER — Other Ambulatory Visit: Payer: Self-pay

## 2022-06-25 DIAGNOSIS — C61 Malignant neoplasm of prostate: Secondary | ICD-10-CM | POA: Diagnosis not present

## 2022-06-25 LAB — RAD ONC ARIA SESSION SUMMARY
Course Elapsed Days: 2
Plan Fractions Treated to Date: 3
Plan Prescribed Dose Per Fraction: 2.5 Gy
Plan Total Fractions Prescribed: 28
Plan Total Prescribed Dose: 70 Gy
Reference Point Dosage Given to Date: 7.5 Gy
Reference Point Session Dosage Given: 2.5 Gy
Session Number: 3

## 2022-06-26 ENCOUNTER — Ambulatory Visit
Admission: RE | Admit: 2022-06-26 | Discharge: 2022-06-26 | Disposition: A | Discharge status: Home / Self Care | Payer: BC Managed Care – PPO | Source: Ambulatory Visit | Attending: Radiation Oncology | Admitting: Radiation Oncology

## 2022-06-26 ENCOUNTER — Other Ambulatory Visit: Payer: Self-pay

## 2022-06-26 DIAGNOSIS — C61 Malignant neoplasm of prostate: Secondary | ICD-10-CM | POA: Diagnosis not present

## 2022-06-26 LAB — RAD ONC ARIA SESSION SUMMARY
Course Elapsed Days: 3
Plan Fractions Treated to Date: 4
Plan Prescribed Dose Per Fraction: 2.5 Gy
Plan Total Fractions Prescribed: 28
Plan Total Prescribed Dose: 70 Gy
Reference Point Dosage Given to Date: 10 Gy
Reference Point Session Dosage Given: 2.5 Gy
Session Number: 4

## 2022-06-27 ENCOUNTER — Ambulatory Visit
Admission: RE | Admit: 2022-06-27 | Discharge: 2022-06-27 | Disposition: A | Discharge status: Home / Self Care | Payer: BC Managed Care – PPO | Source: Ambulatory Visit | Attending: Radiation Oncology | Admitting: Radiation Oncology

## 2022-06-27 ENCOUNTER — Other Ambulatory Visit: Payer: Self-pay

## 2022-06-27 DIAGNOSIS — C61 Malignant neoplasm of prostate: Secondary | ICD-10-CM | POA: Diagnosis not present

## 2022-06-27 LAB — RAD ONC ARIA SESSION SUMMARY
Course Elapsed Days: 4
Plan Fractions Treated to Date: 5
Plan Prescribed Dose Per Fraction: 2.5 Gy
Plan Total Fractions Prescribed: 28
Plan Total Prescribed Dose: 70 Gy
Reference Point Dosage Given to Date: 12.5 Gy
Reference Point Session Dosage Given: 2.5 Gy
Session Number: 5

## 2022-06-27 NOTE — Progress Notes (Signed)
Pt here for patient teaching. Pt given Radiation and You booklet and skin care instructions.  Reviewed areas of pertinence such as diarrhea, fatigue, hair loss, nausea and vomiting, sexual and fertility changes, skin changes, urinary and bladder changes, and taste changes. Pt able to give teach back of to pat skin, use unscented/gentle soap, use baby wipes, have Imodium on hand, drink plenty of water, and sitz bath, avoid applying anything to skin within 4 hours of treatment and to use an electric razor if they must shave. Pt verbalizes understanding of information given and will contact nursing with any questions or concerns.     [email or web address].org/treatmentinformation/whattoexpect/index      

## 2022-06-30 ENCOUNTER — Other Ambulatory Visit: Payer: Self-pay

## 2022-06-30 ENCOUNTER — Ambulatory Visit
Admission: RE | Admit: 2022-06-30 | Discharge: 2022-06-30 | Disposition: A | Discharge status: Home / Self Care | Payer: BC Managed Care – PPO | Source: Ambulatory Visit | Attending: Radiation Oncology | Admitting: Radiation Oncology

## 2022-06-30 DIAGNOSIS — C61 Malignant neoplasm of prostate: Secondary | ICD-10-CM | POA: Insufficient documentation

## 2022-06-30 DIAGNOSIS — R300 Dysuria: Secondary | ICD-10-CM | POA: Insufficient documentation

## 2022-06-30 LAB — RAD ONC ARIA SESSION SUMMARY
Course Elapsed Days: 7
Plan Fractions Treated to Date: 6
Plan Prescribed Dose Per Fraction: 2.5 Gy
Plan Total Fractions Prescribed: 28
Plan Total Prescribed Dose: 70 Gy
Reference Point Dosage Given to Date: 15 Gy
Reference Point Session Dosage Given: 2.5 Gy
Session Number: 6

## 2022-07-01 ENCOUNTER — Ambulatory Visit
Admission: RE | Admit: 2022-07-01 | Discharge: 2022-07-01 | Disposition: A | Discharge status: Home / Self Care | Payer: BC Managed Care – PPO | Source: Ambulatory Visit | Attending: Radiation Oncology | Admitting: Radiation Oncology

## 2022-07-01 ENCOUNTER — Other Ambulatory Visit: Payer: Self-pay

## 2022-07-01 DIAGNOSIS — R300 Dysuria: Secondary | ICD-10-CM | POA: Diagnosis not present

## 2022-07-01 LAB — RAD ONC ARIA SESSION SUMMARY
Course Elapsed Days: 8
Plan Fractions Treated to Date: 7
Plan Prescribed Dose Per Fraction: 2.5 Gy
Plan Total Fractions Prescribed: 28
Plan Total Prescribed Dose: 70 Gy
Reference Point Dosage Given to Date: 17.5 Gy
Reference Point Session Dosage Given: 2.5 Gy
Session Number: 7

## 2022-07-02 ENCOUNTER — Other Ambulatory Visit: Payer: Self-pay

## 2022-07-02 ENCOUNTER — Ambulatory Visit
Admission: RE | Admit: 2022-07-02 | Discharge: 2022-07-02 | Disposition: A | Discharge status: Home / Self Care | Payer: BC Managed Care – PPO | Source: Ambulatory Visit | Attending: Radiation Oncology | Admitting: Radiation Oncology

## 2022-07-02 DIAGNOSIS — R300 Dysuria: Secondary | ICD-10-CM | POA: Diagnosis not present

## 2022-07-02 LAB — RAD ONC ARIA SESSION SUMMARY
Course Elapsed Days: 9
Plan Fractions Treated to Date: 8
Plan Prescribed Dose Per Fraction: 2.5 Gy
Plan Total Fractions Prescribed: 28
Plan Total Prescribed Dose: 70 Gy
Reference Point Dosage Given to Date: 20 Gy
Reference Point Session Dosage Given: 2.5 Gy
Session Number: 8

## 2022-07-03 ENCOUNTER — Ambulatory Visit
Admission: RE | Admit: 2022-07-03 | Discharge: 2022-07-03 | Disposition: A | Discharge status: Home / Self Care | Payer: BC Managed Care – PPO | Source: Ambulatory Visit | Attending: Radiation Oncology | Admitting: Radiation Oncology

## 2022-07-03 ENCOUNTER — Other Ambulatory Visit: Payer: Self-pay

## 2022-07-03 DIAGNOSIS — R300 Dysuria: Secondary | ICD-10-CM | POA: Diagnosis not present

## 2022-07-03 LAB — RAD ONC ARIA SESSION SUMMARY
Course Elapsed Days: 10
Plan Fractions Treated to Date: 9
Plan Prescribed Dose Per Fraction: 2.5 Gy
Plan Total Fractions Prescribed: 28
Plan Total Prescribed Dose: 70 Gy
Reference Point Dosage Given to Date: 22.5 Gy
Reference Point Session Dosage Given: 2.5 Gy
Session Number: 9

## 2022-07-04 ENCOUNTER — Ambulatory Visit
Admission: RE | Admit: 2022-07-04 | Discharge: 2022-07-04 | Disposition: A | Discharge status: Home / Self Care | Payer: BC Managed Care – PPO | Source: Ambulatory Visit | Attending: Radiation Oncology | Admitting: Radiation Oncology

## 2022-07-04 ENCOUNTER — Other Ambulatory Visit: Payer: Self-pay

## 2022-07-04 DIAGNOSIS — R300 Dysuria: Secondary | ICD-10-CM | POA: Diagnosis not present

## 2022-07-04 LAB — RAD ONC ARIA SESSION SUMMARY
Course Elapsed Days: 11
Plan Fractions Treated to Date: 10
Plan Prescribed Dose Per Fraction: 2.5 Gy
Plan Total Fractions Prescribed: 28
Plan Total Prescribed Dose: 70 Gy
Reference Point Dosage Given to Date: 25 Gy
Reference Point Session Dosage Given: 2.5 Gy
Session Number: 10

## 2022-07-07 ENCOUNTER — Other Ambulatory Visit: Payer: Self-pay

## 2022-07-07 ENCOUNTER — Ambulatory Visit
Admission: RE | Admit: 2022-07-07 | Discharge: 2022-07-07 | Disposition: A | Discharge status: Home / Self Care | Payer: BC Managed Care – PPO | Source: Ambulatory Visit | Attending: Radiation Oncology | Admitting: Radiation Oncology

## 2022-07-07 DIAGNOSIS — R300 Dysuria: Secondary | ICD-10-CM | POA: Diagnosis not present

## 2022-07-07 LAB — RAD ONC ARIA SESSION SUMMARY
Course Elapsed Days: 14
Plan Fractions Treated to Date: 11
Plan Prescribed Dose Per Fraction: 2.5 Gy
Plan Total Fractions Prescribed: 28
Plan Total Prescribed Dose: 70 Gy
Reference Point Dosage Given to Date: 27.5 Gy
Reference Point Session Dosage Given: 2.5 Gy
Session Number: 11

## 2022-07-08 ENCOUNTER — Other Ambulatory Visit: Payer: Self-pay

## 2022-07-08 ENCOUNTER — Ambulatory Visit
Admission: RE | Admit: 2022-07-08 | Discharge: 2022-07-08 | Disposition: A | Discharge status: Home / Self Care | Payer: BC Managed Care – PPO | Source: Ambulatory Visit | Attending: Radiation Oncology | Admitting: Radiation Oncology

## 2022-07-08 DIAGNOSIS — R300 Dysuria: Secondary | ICD-10-CM | POA: Diagnosis not present

## 2022-07-08 LAB — RAD ONC ARIA SESSION SUMMARY
Course Elapsed Days: 15
Plan Fractions Treated to Date: 12
Plan Prescribed Dose Per Fraction: 2.5 Gy
Plan Total Fractions Prescribed: 28
Plan Total Prescribed Dose: 70 Gy
Reference Point Dosage Given to Date: 30 Gy
Reference Point Session Dosage Given: 2.5 Gy
Session Number: 12

## 2022-07-09 ENCOUNTER — Other Ambulatory Visit: Payer: Self-pay

## 2022-07-09 ENCOUNTER — Ambulatory Visit
Admission: RE | Admit: 2022-07-09 | Discharge: 2022-07-09 | Disposition: A | Discharge status: Home / Self Care | Payer: BC Managed Care – PPO | Source: Ambulatory Visit | Attending: Radiation Oncology | Admitting: Radiation Oncology

## 2022-07-09 DIAGNOSIS — R300 Dysuria: Secondary | ICD-10-CM | POA: Diagnosis not present

## 2022-07-09 LAB — RAD ONC ARIA SESSION SUMMARY
Course Elapsed Days: 16
Plan Fractions Treated to Date: 13
Plan Prescribed Dose Per Fraction: 2.5 Gy
Plan Total Fractions Prescribed: 28
Plan Total Prescribed Dose: 70 Gy
Reference Point Dosage Given to Date: 32.5 Gy
Reference Point Session Dosage Given: 2.5 Gy
Session Number: 13

## 2022-07-10 ENCOUNTER — Ambulatory Visit
Admission: RE | Admit: 2022-07-10 | Discharge: 2022-07-10 | Disposition: A | Discharge status: Home / Self Care | Payer: BC Managed Care – PPO | Source: Ambulatory Visit | Attending: Radiation Oncology | Admitting: Radiation Oncology

## 2022-07-10 ENCOUNTER — Other Ambulatory Visit: Payer: Self-pay

## 2022-07-10 DIAGNOSIS — R300 Dysuria: Secondary | ICD-10-CM | POA: Diagnosis not present

## 2022-07-10 LAB — RAD ONC ARIA SESSION SUMMARY
Course Elapsed Days: 17
Plan Fractions Treated to Date: 14
Plan Prescribed Dose Per Fraction: 2.5 Gy
Plan Total Fractions Prescribed: 28
Plan Total Prescribed Dose: 70 Gy
Reference Point Dosage Given to Date: 35 Gy
Reference Point Session Dosage Given: 2.5 Gy
Session Number: 14

## 2022-07-11 ENCOUNTER — Ambulatory Visit
Admission: RE | Admit: 2022-07-11 | Discharge: 2022-07-11 | Disposition: A | Discharge status: Home / Self Care | Payer: BC Managed Care – PPO | Source: Ambulatory Visit | Attending: Radiation Oncology | Admitting: Radiation Oncology

## 2022-07-11 ENCOUNTER — Telehealth: Payer: Self-pay

## 2022-07-11 ENCOUNTER — Other Ambulatory Visit: Payer: Self-pay

## 2022-07-11 DIAGNOSIS — R300 Dysuria: Secondary | ICD-10-CM | POA: Diagnosis not present

## 2022-07-11 LAB — RAD ONC ARIA SESSION SUMMARY
Course Elapsed Days: 18
Plan Fractions Treated to Date: 15
Plan Prescribed Dose Per Fraction: 2.5 Gy
Plan Total Fractions Prescribed: 28
Plan Total Prescribed Dose: 70 Gy
Reference Point Dosage Given to Date: 37.5 Gy
Reference Point Session Dosage Given: 2.5 Gy
Session Number: 15

## 2022-07-11 NOTE — Telephone Encounter (Signed)
Notified Patient of completion of FMLA forms. Patient requested that FMLA forms not be faxed. Copy of forms emailed to Patient as requested. Copy of Spouse's FMLA forms completed as requested by Patient and emailed to Patient as requested.

## 2022-07-14 ENCOUNTER — Ambulatory Visit
Admission: RE | Admit: 2022-07-14 | Discharge: 2022-07-14 | Disposition: A | Discharge status: Home / Self Care | Payer: BC Managed Care – PPO | Source: Ambulatory Visit | Attending: Radiation Oncology | Admitting: Radiation Oncology

## 2022-07-14 ENCOUNTER — Other Ambulatory Visit: Payer: Self-pay

## 2022-07-14 DIAGNOSIS — R300 Dysuria: Secondary | ICD-10-CM | POA: Diagnosis not present

## 2022-07-14 LAB — RAD ONC ARIA SESSION SUMMARY
Course Elapsed Days: 21
Plan Fractions Treated to Date: 16
Plan Prescribed Dose Per Fraction: 2.5 Gy
Plan Total Fractions Prescribed: 28
Plan Total Prescribed Dose: 70 Gy
Reference Point Dosage Given to Date: 40 Gy
Reference Point Session Dosage Given: 2.5 Gy
Session Number: 16

## 2022-07-15 ENCOUNTER — Other Ambulatory Visit: Payer: Self-pay

## 2022-07-15 ENCOUNTER — Ambulatory Visit
Admission: RE | Admit: 2022-07-15 | Discharge: 2022-07-15 | Disposition: A | Discharge status: Home / Self Care | Payer: BC Managed Care – PPO | Source: Ambulatory Visit | Attending: Radiation Oncology | Admitting: Radiation Oncology

## 2022-07-15 DIAGNOSIS — R300 Dysuria: Secondary | ICD-10-CM | POA: Diagnosis not present

## 2022-07-15 LAB — RAD ONC ARIA SESSION SUMMARY
Course Elapsed Days: 22
Plan Fractions Treated to Date: 17
Plan Prescribed Dose Per Fraction: 2.5 Gy
Plan Total Fractions Prescribed: 28
Plan Total Prescribed Dose: 70 Gy
Reference Point Dosage Given to Date: 42.5 Gy
Reference Point Session Dosage Given: 2.5 Gy
Session Number: 17

## 2022-07-16 ENCOUNTER — Other Ambulatory Visit: Payer: Self-pay

## 2022-07-16 ENCOUNTER — Ambulatory Visit
Admission: RE | Admit: 2022-07-16 | Discharge: 2022-07-16 | Disposition: A | Discharge status: Home / Self Care | Payer: BC Managed Care – PPO | Source: Ambulatory Visit | Attending: Radiation Oncology | Admitting: Radiation Oncology

## 2022-07-16 DIAGNOSIS — R300 Dysuria: Secondary | ICD-10-CM | POA: Diagnosis not present

## 2022-07-16 LAB — RAD ONC ARIA SESSION SUMMARY
Course Elapsed Days: 23
Plan Fractions Treated to Date: 18
Plan Prescribed Dose Per Fraction: 2.5 Gy
Plan Total Fractions Prescribed: 28
Plan Total Prescribed Dose: 70 Gy
Reference Point Dosage Given to Date: 45 Gy
Reference Point Session Dosage Given: 2.5 Gy
Session Number: 18

## 2022-07-17 ENCOUNTER — Ambulatory Visit
Admission: RE | Admit: 2022-07-17 | Discharge: 2022-07-17 | Disposition: A | Discharge status: Home / Self Care | Payer: BC Managed Care – PPO | Source: Ambulatory Visit | Attending: Radiation Oncology | Admitting: Radiation Oncology

## 2022-07-17 ENCOUNTER — Other Ambulatory Visit: Payer: Self-pay

## 2022-07-17 DIAGNOSIS — R300 Dysuria: Secondary | ICD-10-CM | POA: Diagnosis not present

## 2022-07-17 LAB — RAD ONC ARIA SESSION SUMMARY
Course Elapsed Days: 24
Plan Fractions Treated to Date: 19
Plan Prescribed Dose Per Fraction: 2.5 Gy
Plan Total Fractions Prescribed: 28
Plan Total Prescribed Dose: 70 Gy
Reference Point Dosage Given to Date: 47.5 Gy
Reference Point Session Dosage Given: 2.5 Gy
Session Number: 19

## 2022-07-18 ENCOUNTER — Other Ambulatory Visit: Payer: Self-pay

## 2022-07-18 ENCOUNTER — Ambulatory Visit
Admission: RE | Admit: 2022-07-18 | Discharge: 2022-07-18 | Disposition: A | Discharge status: Home / Self Care | Payer: BC Managed Care – PPO | Source: Ambulatory Visit | Attending: Radiation Oncology | Admitting: Radiation Oncology

## 2022-07-18 ENCOUNTER — Ambulatory Visit: Payer: BC Managed Care – PPO

## 2022-07-18 DIAGNOSIS — R300 Dysuria: Secondary | ICD-10-CM | POA: Diagnosis not present

## 2022-07-18 LAB — RAD ONC ARIA SESSION SUMMARY
Course Elapsed Days: 25
Plan Fractions Treated to Date: 20
Plan Prescribed Dose Per Fraction: 2.5 Gy
Plan Total Fractions Prescribed: 28
Plan Total Prescribed Dose: 70 Gy
Reference Point Dosage Given to Date: 50 Gy
Reference Point Session Dosage Given: 2.5 Gy
Session Number: 20

## 2022-07-21 ENCOUNTER — Other Ambulatory Visit: Payer: Self-pay

## 2022-07-21 ENCOUNTER — Ambulatory Visit
Admission: RE | Admit: 2022-07-21 | Discharge: 2022-07-21 | Disposition: A | Discharge status: Home / Self Care | Payer: BC Managed Care – PPO | Source: Ambulatory Visit | Attending: Radiation Oncology | Admitting: Radiation Oncology

## 2022-07-21 DIAGNOSIS — R300 Dysuria: Secondary | ICD-10-CM | POA: Diagnosis not present

## 2022-07-21 LAB — RAD ONC ARIA SESSION SUMMARY
Course Elapsed Days: 28
Plan Fractions Treated to Date: 21
Plan Prescribed Dose Per Fraction: 2.5 Gy
Plan Total Fractions Prescribed: 28
Plan Total Prescribed Dose: 70 Gy
Reference Point Dosage Given to Date: 52.5 Gy
Reference Point Session Dosage Given: 2.5 Gy
Session Number: 21

## 2022-07-22 ENCOUNTER — Other Ambulatory Visit: Payer: Self-pay

## 2022-07-22 ENCOUNTER — Ambulatory Visit
Admission: RE | Admit: 2022-07-22 | Discharge: 2022-07-22 | Disposition: A | Discharge status: Home / Self Care | Payer: BC Managed Care – PPO | Source: Ambulatory Visit | Attending: Radiation Oncology | Admitting: Radiation Oncology

## 2022-07-22 DIAGNOSIS — R300 Dysuria: Secondary | ICD-10-CM | POA: Diagnosis not present

## 2022-07-22 LAB — RAD ONC ARIA SESSION SUMMARY
Course Elapsed Days: 29
Plan Fractions Treated to Date: 22
Plan Prescribed Dose Per Fraction: 2.5 Gy
Plan Total Fractions Prescribed: 28
Plan Total Prescribed Dose: 70 Gy
Reference Point Dosage Given to Date: 55 Gy
Reference Point Session Dosage Given: 2.5 Gy
Session Number: 22

## 2022-07-23 ENCOUNTER — Other Ambulatory Visit: Payer: Self-pay

## 2022-07-23 ENCOUNTER — Ambulatory Visit
Admission: RE | Admit: 2022-07-23 | Discharge: 2022-07-23 | Disposition: A | Discharge status: Home / Self Care | Payer: BC Managed Care – PPO | Source: Ambulatory Visit | Attending: Radiation Oncology | Admitting: Radiation Oncology

## 2022-07-23 DIAGNOSIS — R300 Dysuria: Secondary | ICD-10-CM | POA: Diagnosis not present

## 2022-07-23 LAB — RAD ONC ARIA SESSION SUMMARY
Course Elapsed Days: 30
Plan Fractions Treated to Date: 23
Plan Prescribed Dose Per Fraction: 2.5 Gy
Plan Total Fractions Prescribed: 28
Plan Total Prescribed Dose: 70 Gy
Reference Point Dosage Given to Date: 57.5 Gy
Reference Point Session Dosage Given: 2.5 Gy
Session Number: 23

## 2022-07-24 ENCOUNTER — Other Ambulatory Visit: Payer: Self-pay

## 2022-07-24 ENCOUNTER — Ambulatory Visit
Admission: RE | Admit: 2022-07-24 | Discharge: 2022-07-24 | Disposition: A | Discharge status: Home / Self Care | Payer: BC Managed Care – PPO | Source: Ambulatory Visit | Attending: Radiation Oncology | Admitting: Radiation Oncology

## 2022-07-24 ENCOUNTER — Ambulatory Visit: Payer: BC Managed Care – PPO

## 2022-07-24 DIAGNOSIS — R300 Dysuria: Secondary | ICD-10-CM | POA: Diagnosis not present

## 2022-07-24 LAB — RAD ONC ARIA SESSION SUMMARY
Course Elapsed Days: 31
Plan Fractions Treated to Date: 24
Plan Prescribed Dose Per Fraction: 2.5 Gy
Plan Total Fractions Prescribed: 28
Plan Total Prescribed Dose: 70 Gy
Reference Point Dosage Given to Date: 60 Gy
Reference Point Session Dosage Given: 2.5 Gy
Session Number: 24

## 2022-07-25 ENCOUNTER — Ambulatory Visit: Payer: BC Managed Care – PPO

## 2022-07-25 ENCOUNTER — Ambulatory Visit
Admission: RE | Admit: 2022-07-25 | Discharge: 2022-07-25 | Disposition: A | Discharge status: Home / Self Care | Payer: BC Managed Care – PPO | Source: Ambulatory Visit | Attending: Radiation Oncology | Admitting: Radiation Oncology

## 2022-07-25 ENCOUNTER — Other Ambulatory Visit: Payer: Self-pay

## 2022-07-25 DIAGNOSIS — R300 Dysuria: Secondary | ICD-10-CM | POA: Diagnosis not present

## 2022-07-25 LAB — RAD ONC ARIA SESSION SUMMARY
Course Elapsed Days: 32
Plan Fractions Treated to Date: 25
Plan Prescribed Dose Per Fraction: 2.5 Gy
Plan Total Fractions Prescribed: 28
Plan Total Prescribed Dose: 70 Gy
Reference Point Dosage Given to Date: 62.5 Gy
Reference Point Session Dosage Given: 2.5 Gy
Session Number: 25

## 2022-07-28 ENCOUNTER — Other Ambulatory Visit: Payer: Self-pay

## 2022-07-28 ENCOUNTER — Ambulatory Visit
Admission: RE | Admit: 2022-07-28 | Discharge: 2022-07-28 | Disposition: A | Discharge status: Home / Self Care | Payer: BC Managed Care – PPO | Source: Ambulatory Visit | Attending: Radiation Oncology | Admitting: Radiation Oncology

## 2022-07-28 DIAGNOSIS — R300 Dysuria: Secondary | ICD-10-CM | POA: Diagnosis not present

## 2022-07-28 LAB — RAD ONC ARIA SESSION SUMMARY
Course Elapsed Days: 35
Plan Fractions Treated to Date: 26
Plan Prescribed Dose Per Fraction: 2.5 Gy
Plan Total Fractions Prescribed: 28
Plan Total Prescribed Dose: 70 Gy
Reference Point Dosage Given to Date: 65 Gy
Reference Point Session Dosage Given: 2.5 Gy
Session Number: 26

## 2022-07-29 ENCOUNTER — Other Ambulatory Visit: Payer: Self-pay

## 2022-07-29 ENCOUNTER — Ambulatory Visit
Admission: RE | Admit: 2022-07-29 | Discharge: 2022-07-29 | Disposition: A | Discharge status: Home / Self Care | Payer: BC Managed Care – PPO | Source: Ambulatory Visit | Attending: Radiation Oncology | Admitting: Radiation Oncology

## 2022-07-29 DIAGNOSIS — R300 Dysuria: Secondary | ICD-10-CM

## 2022-07-29 DIAGNOSIS — C61 Malignant neoplasm of prostate: Secondary | ICD-10-CM

## 2022-07-29 LAB — RAD ONC ARIA SESSION SUMMARY
Course Elapsed Days: 36
Plan Fractions Treated to Date: 27
Plan Prescribed Dose Per Fraction: 2.5 Gy
Plan Total Fractions Prescribed: 28
Plan Total Prescribed Dose: 70 Gy
Reference Point Dosage Given to Date: 67.5 Gy
Reference Point Session Dosage Given: 2.5 Gy
Session Number: 27

## 2022-07-29 LAB — URINALYSIS, COMPLETE (UACMP) WITH MICROSCOPIC
Bacteria, UA: NONE SEEN
Bilirubin Urine: NEGATIVE
Glucose, UA: NEGATIVE mg/dL
Hgb urine dipstick: NEGATIVE
Ketones, ur: NEGATIVE mg/dL
Leukocytes,Ua: NEGATIVE
Nitrite: POSITIVE — AB
Protein, ur: NEGATIVE mg/dL
Specific Gravity, Urine: 1.014 (ref 1.005–1.030)
pH: 7 (ref 5.0–8.0)

## 2022-07-30 ENCOUNTER — Telehealth: Payer: Self-pay

## 2022-07-30 ENCOUNTER — Other Ambulatory Visit: Payer: Self-pay

## 2022-07-30 ENCOUNTER — Other Ambulatory Visit: Payer: Self-pay | Admitting: Urology

## 2022-07-30 ENCOUNTER — Ambulatory Visit
Admission: RE | Admit: 2022-07-30 | Discharge: 2022-07-30 | Disposition: A | Discharge status: Home / Self Care | Payer: BC Managed Care – PPO | Source: Ambulatory Visit | Attending: Radiation Oncology | Admitting: Radiation Oncology

## 2022-07-30 DIAGNOSIS — C61 Malignant neoplasm of prostate: Secondary | ICD-10-CM | POA: Insufficient documentation

## 2022-07-30 DIAGNOSIS — R300 Dysuria: Secondary | ICD-10-CM | POA: Insufficient documentation

## 2022-07-30 LAB — RAD ONC ARIA SESSION SUMMARY
Course Elapsed Days: 37
Plan Fractions Treated to Date: 28
Plan Prescribed Dose Per Fraction: 2.5 Gy
Plan Total Fractions Prescribed: 28
Plan Total Prescribed Dose: 70 Gy
Reference Point Dosage Given to Date: 70 Gy
Reference Point Session Dosage Given: 2.5 Gy
Session Number: 28

## 2022-07-30 LAB — URINE CULTURE: Culture: NO GROWTH

## 2022-07-30 MED ORDER — PHENAZOPYRIDINE HCL 200 MG PO TABS: 200.0000 mg | ORAL_TABLET | Freq: Three times a day (TID) | ORAL | 1 refills | Status: AC | PRN

## 2022-07-30 NOTE — Progress Notes (Signed)
Please call patient with normal result.  Thanks. MM 

## 2022-07-30 NOTE — Telephone Encounter (Signed)
RN called patient no answer left message to inform him that his urinalysis showed positive for nitrates which could be false positive for infection due to him using AZO medication.  Advised that we would be waiting for the urine culture results per Ashlyn Bruning, PA-C before she prescribed antibiotics and that she would be happy to send script in for Pyridium if he wants to try.

## 2022-07-31 ENCOUNTER — Telehealth: Payer: Self-pay

## 2022-07-31 NOTE — Telephone Encounter (Signed)
RN called patient per Dr. Kathrynn Running to inform him of urine culture results had to leave message.

## 2022-07-31 NOTE — Progress Notes (Signed)
Patient was a RadOnc Consult on 12/18/21 for his stage T1c adenocarcinoma of the prostate with Gleason score of 3+4, and PSA of 4.12. Patient proceed with treatment recommendations of 5.5 weeks of external radiation and had his final radiation treatment on 07/30/22.   Patient is scheduled for a post treatment nurse call on 09/09/22 and has his first post treatment appointment at [facility] on 09/24/22.

## 2022-08-04 NOTE — Radiation Completion Notes (Addendum)
  Radiation Oncology         ([phone]) [phone] ________________________________  Name: Gary Rose MRN: [id number]  Date: 07/31/2022  DOB: 1960-04-25  End of Treatment Note  Patient Name: Gary Rose, Gary Rose MRN: [id number] Date of Birth: Nov 19, 1960 Referring Physician: Maryelizabeth Rowan, M.D. Date of Service: 2022-08-04 Radiation Oncologist: Margaretmary Bayley, M.D. [facility]    RADIATION ONCOLOGY END OF TREATMENT NOTE     Diagnosis: 62 y.o. gentleman with Stage T1c adenocarcinoma of the prostate with Gleason score of 3+4, and PSA of 4.12.   Intent: Curative     ==========DELIVERED PLANS==========  First Treatment Date: 2022-06-23 - Last Treatment Date: 2022-07-30   Plan Name: Prostate Site: Prostate Technique: IMRT Mode: Photon Dose Per Fraction: 2.5 Gy Prescribed Dose (Delivered / Prescribed): 70 Gy / 70 Gy Prescribed Fxs (Delivered / Prescribed): 28 / 28     ==========ON TREATMENT VISIT DATES========== 2022-06-27, 2022-07-04, 2022-07-11, 2022-07-17, 2022-07-24, 2022-07-29   See weekly On Treatment Notes in Epic for details. The patient tolerated treatment well with only mild urinary symptoms and modest fatigue. He did experience some dysuria, frequency and nocturia which were managed with Rapaflo. He also had some mild constipation that was managed with Miralax.  The patient will receive a call in about one month from the radiation oncology department. He will continue follow up with his urologist, Dr. Retta Diones as well.  ------------------------------------------------   Margaretmary Dys, MD [facility]  Radiation Oncology Direct Dial: [phone]  Fax: [phone] [email or web address]  Skype  LinkedIn

## 2022-08-29 ENCOUNTER — Other Ambulatory Visit: Payer: Self-pay | Admitting: Urology

## 2022-08-29 DIAGNOSIS — C61 Malignant neoplasm of prostate: Secondary | ICD-10-CM

## 2022-09-09 ENCOUNTER — Other Ambulatory Visit: Payer: Self-pay | Admitting: Family Medicine

## 2022-09-09 ENCOUNTER — Ambulatory Visit
Admission: RE | Admit: 2022-09-09 | Discharge: 2022-09-09 | Disposition: A | Discharge status: Home / Self Care | Payer: BC Managed Care – PPO | Source: Ambulatory Visit | Attending: Radiation Oncology | Admitting: Radiation Oncology

## 2022-09-09 ENCOUNTER — Ambulatory Visit
Admission: RE | Admit: 2022-09-09 | Discharge: 2022-09-09 | Disposition: A | Discharge status: Home / Self Care | Payer: BC Managed Care – PPO | Source: Ambulatory Visit | Attending: Family Medicine | Admitting: Family Medicine

## 2022-09-09 DIAGNOSIS — R300 Dysuria: Secondary | ICD-10-CM | POA: Insufficient documentation

## 2022-09-09 DIAGNOSIS — R058 Other specified cough: Secondary | ICD-10-CM

## 2022-09-09 DIAGNOSIS — C61 Malignant neoplasm of prostate: Secondary | ICD-10-CM | POA: Insufficient documentation

## 2022-09-09 NOTE — Progress Notes (Signed)
  Radiation Oncology         ([phone]) [phone] ________________________________  Name: Gary Rose MRN: [id number]  Date of Service: 09/09/2022  DOB: 1961-03-11  Post Treatment Telephone Note  Diagnosis:  Malignant neoplasm of prostate (HCC) (as documented in provider EOT note)   Pre Treatment IPSS Score: 8 (as documented in the provider consult note)   The patient was available for call today.   Symptoms of fatigue have improved since completing therapy.  Symptoms of bladder changes have improved since completing therapy. Current symptoms include polyuria, and medications for bladder symptoms include Uroxatral, which is helping to improve symptoms.  Symptoms of bowel changes have improved since completing therapy. Current symptoms include none, and medications for bowel symptoms include none.     Post Treatment IPSS Score: IPSS Questionnaire (AUA-7): Over the past month.   1)  How often have you had a sensation of not emptying your bladder completely after you finish urinating?  1 - Less than 1 time in 5  2)  How often have you had to urinate again less than two hours after you finished urinating? 2 - Less than half the time  3)  How often have you found you stopped and started again several times when you urinated?  1 - Less than 1 time in 5  4) How difficult have you found it to postpone urination?  1 - Less than 1 time in 5  5) How often have you had a weak urinary stream?  1 - Less than 1 time in 5  6) How often have you had to push or strain to begin urination?  0 - Not at all  7) How many times did you most typically get up to urinate from the time you went to bed until the time you got up in the morning?  5 - 5+ times  Total score:  11. Which indicates moderate symptoms  0-7 mildly symptomatic   8-19 moderately symptomatic   20-35 severely symptomatic    Patient does not currently have any scheduled follow up with his urologist to his knowledge so he was advised to  call [facility] to schedule his post-treatment follow up with Dr. Retta Diones for ongoing surveillance. He was counseled that PSA levels will be drawn in the urology office, and was reassured that additional time is expected to improve bowel and bladder symptoms. He was encouraged to call back with concerns or questions regarding radiation.   This concludes the interview.   Ruel Favors, LPN

## 2022-10-06 ENCOUNTER — Inpatient Hospital Stay: Payer: BC Managed Care – PPO | Attending: Adult Health | Admitting: *Deleted

## 2022-10-06 ENCOUNTER — Encounter: Payer: Self-pay | Admitting: *Deleted

## 2022-10-13 ENCOUNTER — Inpatient Hospital Stay: Payer: BC Managed Care – PPO | Admitting: *Deleted

## 2022-10-13 ENCOUNTER — Encounter: Payer: Self-pay | Admitting: *Deleted

## 2022-10-13 NOTE — Progress Notes (Signed)
SCP Summary has been mailed to patient on  July 8th when the first appt was scheduled. Pt missed this appt because of a family appt with his dad. The appt was rescheduled for today July 15th. Pt was not available. I have mailed a copy of summary to Dr. Dewey(PCP). I left message and cb number to pt's phone as well as wife's phone. He knows if he has questions he can call me.
